# Patient Record
Sex: Male | Born: 1980
Health system: Southern US, Community
[De-identification: ages and names within clinical notes are randomized; demographics above are authoritative.]

## PROBLEM LIST (undated history)

## (undated) DIAGNOSIS — E669 Obesity, unspecified: Secondary | ICD-10-CM

## (undated) DIAGNOSIS — J45909 Unspecified asthma, uncomplicated: Secondary | ICD-10-CM

## (undated) HISTORY — DX: Obesity, unspecified: E66.9

---

## 2004-12-02 ENCOUNTER — Emergency Department (HOSPITAL_COMMUNITY): Admission: EM | Admit: 2004-12-02 | Discharge: 2004-12-02 | Payer: Self-pay | Admitting: Emergency Medicine

## 2005-04-28 ENCOUNTER — Emergency Department (HOSPITAL_COMMUNITY): Admission: EM | Admit: 2005-04-28 | Discharge: 2005-04-28 | Payer: Self-pay | Admitting: Emergency Medicine

## 2005-08-10 ENCOUNTER — Emergency Department (HOSPITAL_COMMUNITY): Admission: EM | Admit: 2005-08-10 | Discharge: 2005-08-10 | Payer: Self-pay | Admitting: Emergency Medicine

## 2015-08-15 ENCOUNTER — Emergency Department (HOSPITAL_COMMUNITY)
Admission: EM | Admit: 2015-08-15 | Discharge: 2015-08-15 | Disposition: A | Discharge status: Home / Self Care | Payer: Self-pay | Attending: Emergency Medicine | Admitting: Emergency Medicine

## 2015-08-15 ENCOUNTER — Encounter (HOSPITAL_COMMUNITY): Payer: Self-pay

## 2015-08-15 DIAGNOSIS — F17200 Nicotine dependence, unspecified, uncomplicated: Secondary | ICD-10-CM | POA: Insufficient documentation

## 2015-08-15 DIAGNOSIS — J45909 Unspecified asthma, uncomplicated: Secondary | ICD-10-CM | POA: Insufficient documentation

## 2015-08-15 DIAGNOSIS — J020 Streptococcal pharyngitis: Secondary | ICD-10-CM | POA: Insufficient documentation

## 2015-08-15 DIAGNOSIS — Z79899 Other long term (current) drug therapy: Secondary | ICD-10-CM | POA: Insufficient documentation

## 2015-08-15 HISTORY — DX: Unspecified asthma, uncomplicated: J45.909

## 2015-08-15 LAB — RAPID STREP SCREEN (MED CTR MEBANE ONLY): Streptococcus, Group A Screen (Direct): POSITIVE — AB

## 2015-08-15 MED ORDER — DEXAMETHASONE 4 MG PO TABS
10.0000 mg | ORAL_TABLET | Freq: Once | ORAL | Status: AC
  Administered 2015-08-15: 10 mg via ORAL
  Filled 2015-08-15: qty 2

## 2015-08-15 MED ORDER — PENICILLIN G BENZATHINE 1200000 UNIT/2ML IM SUSP
1.2000 10*6.[IU] | Freq: Once | INTRAMUSCULAR | Status: AC
  Administered 2015-08-15: 1.2 10*6.[IU] via INTRAMUSCULAR
  Filled 2015-08-15: qty 2

## 2015-08-15 NOTE — ED Provider Notes (Signed)
CSN: [id number]     Arrival date & time 08/15/15  1143 History   First MD Initiated Contact with Patient 08/15/15 1209     Chief Complaint  Patient presents with  . Sore Throat    HPI  35 YOM presents today with complaints of sore throat. Pt notes that symptoms started yesterday with sore throat, subjective fever, and painful swallowing. Pt denies any difficulty swallowing, changes in speech, drooling, neck stiffness, cough, chest pain, SOB, abd pain or vomiting. Pt notes that he has not tried nay over the counter therapies prior to arrival. No history of the same, no close sick contacts. No significant past medical history.   Past Medical History  Diagnosis Date  . Asthma    History reviewed. No pertinent past surgical history. No family history on file. Social History  Substance Use Topics  . Smoking status: Current Every Day Smoker  . Smokeless tobacco: None  . Alcohol Use: Yes     Comment: occasionally     Review of Systems  All other systems reviewed and are negative.   Allergies  Review of patient's allergies indicates no known allergies.  Home Medications   Prior to Admission medications   Medication Sig Start Date End Date Taking? Authorizing Provider  acetaminophen (TYLENOL) 500 MG tablet Take 1,000 mg by mouth every 4 (four) hours as needed for mild pain, moderate pain or headache.   Yes Historical Provider, MD  albuterol (PROVENTIL HFA;VENTOLIN HFA) 108 (90 Base) MCG/ACT inhaler Inhale 2 puffs into the lungs every 4 (four) hours as needed for wheezing or shortness of breath.   Yes Historical Provider, MD  diphenhydrAMINE (BENADRYL) 25 mg capsule Take 25 mg by mouth every 6 (six) hours as needed for itching or allergies.   Yes Historical Provider, MD   BP 108/78 mmHg  Pulse 103  Temp(Src) 100.1 F (37.8 C) (Oral)  Resp 24  SpO2 96%   Physical Exam  Constitutional: He is oriented to person, place, and time. He appears well-developed and well-nourished.  HENT:   Head: Normocephalic and atraumatic.  Bilateral kissing tonsill's, minor exudate, no identifiable post pharyngeal swelling or edema. No swelling of the uvula; midline. No ulcerations noted   Eyes: Conjunctivae are normal. Pupils are equal, round, and reactive to light. Right eye exhibits no discharge. Left eye exhibits no discharge. No scleral icterus.  Neck: Normal range of motion. No JVD present. No tracheal deviation present.  Cardiovascular: Regular rhythm, normal heart sounds and intact distal pulses.  Exam reveals no friction rub.   No murmur heard. Pulmonary/Chest: Effort normal and breath sounds normal. No stridor. No respiratory distress. He has no wheezes. He has no rales. He exhibits no tenderness.  Musculoskeletal: Normal range of motion. He exhibits no edema or tenderness.  Neurological: He is alert and oriented to person, place, and time. Coordination normal.  Skin: Skin is warm and dry. No rash noted. No erythema. No pallor.  Psychiatric: He has a normal mood and affect. His behavior is normal. Judgment and thought content normal.  Nursing note and vitals reviewed.   ED Course  Procedures (including critical care time) Labs Review Labs Reviewed  RAPID STREP SCREEN (NOT AT [facility]) - Abnormal; Notable for the following:    Streptococcus, Group A Screen (Direct) POSITIVE (*)    All other components within normal limits    Imaging Review No results found. I have personally reviewed and evaluated these images and lab results as part of my medical decision-making.  EKG Interpretation None      MDM   Final diagnoses:  Strep pharyngitis    Labs: rapid strep- negative  Imaging:  Consults:  Therapeutics: penicillin , decadron   Discharge Meds:   Assessment/Plan: Pt presents with likely strep pharyngitis. No difficulty swallowing, drooling, dysphonia,muffled voice, stridor, swelling of the neck, trismus, mouth pain, swelling/ pain in submandibular area or floor of  mouth ,assymetry of tonsils, or ulcerations; unlikely epiglottitis, PTA, submandibular space infection, retropharyngeal space infection, or HIV. Pt treated here in the ED with therapeutics listed above, given strict return precautions, PCP follow-up for re-evaluation if symptoms persist beyond 5-7 days in duration, return to the ED if they worsen. Pt verbalized understanding and agreement to today's plan and had no further questions or concerns at the time of discharge.         Eyvonne MechanicJeffrey Vladimir Lenhoff, PA-C 08/16/15 [id number]  Margarita Grizzleanielle Ray, MD 08/18/15 [phone]

## 2015-08-15 NOTE — ED Notes (Signed)
Pt presents with c/o sore throat that started yesterday. Pt's speech is somewhat muffled, but he is able to talk in complete sentences, no distress, ambulatory to triage. Pt reports he started with the sore throat and trouble swallowing yesterday.

## 2015-08-15 NOTE — Discharge Instructions (Signed)
You have been diagnosed with strep pharyngitis today. Please monitor for any worsening signs or symptoms, return to the emergency room immediately if any present.

## 2015-08-23 ENCOUNTER — Emergency Department (HOSPITAL_COMMUNITY)
Admission: EM | Admit: 2015-08-23 | Discharge: 2015-08-23 | Disposition: A | Discharge status: Home / Self Care | Payer: No Typology Code available for payment source | Attending: Emergency Medicine | Admitting: Emergency Medicine

## 2015-08-23 ENCOUNTER — Encounter (HOSPITAL_COMMUNITY): Payer: Self-pay | Admitting: Emergency Medicine

## 2015-08-23 DIAGNOSIS — S0990XA Unspecified injury of head, initial encounter: Secondary | ICD-10-CM | POA: Insufficient documentation

## 2015-08-23 DIAGNOSIS — J45909 Unspecified asthma, uncomplicated: Secondary | ICD-10-CM | POA: Insufficient documentation

## 2015-08-23 DIAGNOSIS — S8991XA Unspecified injury of right lower leg, initial encounter: Secondary | ICD-10-CM | POA: Diagnosis not present

## 2015-08-23 DIAGNOSIS — M7918 Myalgia, other site: Secondary | ICD-10-CM

## 2015-08-23 DIAGNOSIS — Z79899 Other long term (current) drug therapy: Secondary | ICD-10-CM | POA: Diagnosis not present

## 2015-08-23 DIAGNOSIS — Y998 Other external cause status: Secondary | ICD-10-CM | POA: Insufficient documentation

## 2015-08-23 DIAGNOSIS — F17200 Nicotine dependence, unspecified, uncomplicated: Secondary | ICD-10-CM | POA: Diagnosis not present

## 2015-08-23 DIAGNOSIS — Y92410 Unspecified street and highway as the place of occurrence of the external cause: Secondary | ICD-10-CM | POA: Diagnosis not present

## 2015-08-23 DIAGNOSIS — S4990XA Unspecified injury of shoulder and upper arm, unspecified arm, initial encounter: Secondary | ICD-10-CM | POA: Diagnosis not present

## 2015-08-23 DIAGNOSIS — S3992XA Unspecified injury of lower back, initial encounter: Secondary | ICD-10-CM | POA: Insufficient documentation

## 2015-08-23 DIAGNOSIS — Y9389 Activity, other specified: Secondary | ICD-10-CM | POA: Insufficient documentation

## 2015-08-23 MED ORDER — NAPROXEN 375 MG PO TABS: 375.0000 mg | ORAL_TABLET | Freq: Two times a day (BID) | ORAL | Status: DC
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
## 2015-08-25 ENCOUNTER — Emergency Department (HOSPITAL_COMMUNITY)
Admission: EM | Admit: 2015-08-25 | Discharge: 2015-08-25 | Disposition: A | Discharge status: Home / Self Care | Payer: No Typology Code available for payment source | Attending: Emergency Medicine | Admitting: Emergency Medicine

## 2015-08-25 ENCOUNTER — Emergency Department (HOSPITAL_COMMUNITY): Payer: No Typology Code available for payment source

## 2015-08-25 ENCOUNTER — Encounter (HOSPITAL_COMMUNITY): Payer: Self-pay | Admitting: Emergency Medicine

## 2015-08-25 DIAGNOSIS — Y92410 Unspecified street and highway as the place of occurrence of the external cause: Secondary | ICD-10-CM | POA: Insufficient documentation

## 2015-08-25 DIAGNOSIS — Y9389 Activity, other specified: Secondary | ICD-10-CM | POA: Insufficient documentation

## 2015-08-25 DIAGNOSIS — S3992XA Unspecified injury of lower back, initial encounter: Secondary | ICD-10-CM | POA: Diagnosis present

## 2015-08-25 DIAGNOSIS — S29002A Unspecified injury of muscle and tendon of back wall of thorax, initial encounter: Secondary | ICD-10-CM | POA: Insufficient documentation

## 2015-08-25 DIAGNOSIS — Y998 Other external cause status: Secondary | ICD-10-CM | POA: Insufficient documentation

## 2015-08-25 DIAGNOSIS — M549 Dorsalgia, unspecified: Secondary | ICD-10-CM

## 2015-08-25 DIAGNOSIS — J45909 Unspecified asthma, uncomplicated: Secondary | ICD-10-CM | POA: Diagnosis not present

## 2015-08-25 DIAGNOSIS — F17200 Nicotine dependence, unspecified, uncomplicated: Secondary | ICD-10-CM | POA: Insufficient documentation

## 2015-08-25 MED ORDER — IBUPROFEN 400 MG PO TABS
800.0000 mg | ORAL_TABLET | Freq: Once | ORAL | Status: AC
  Administered 2015-08-25: 800 mg via ORAL
  Filled 2015-08-25: qty 2

## 2015-08-25 MED ORDER — METHOCARBAMOL 500 MG PO TABS: 500.0000 mg | ORAL_TABLET | Freq: Two times a day (BID) | ORAL | Status: DC

## 2015-08-25 MED ORDER — IBUPROFEN 800 MG PO TABS: 800.0000 mg | ORAL_TABLET | Freq: Three times a day (TID) | ORAL | Status: DC

## 2015-08-25 MED ORDER — METHOCARBAMOL 500 MG PO TABS
500.0000 mg | ORAL_TABLET | Freq: Once | ORAL | Status: AC
  Administered 2015-08-25: 500 mg via ORAL
  Filled 2015-08-25: qty 1
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
## 2015-08-25 NOTE — Discharge Instructions (Signed)
1. Medications: ibuprofen, robaxin, usual home medications 2. Treatment: rest, drink plenty of fluids 3. Follow Up: please followup with your primary doctor for discussion of your diagnoses and further evaluation after today's visit; if you do not have a primary care doctor use the phone number listed in your discharge paperwork to find one; please return to the ER for numbness, weakness, loss of control of your bowel or bladder, new or worsening symptoms   Back Pain, Adult Back pain is very common. The pain often gets better over time. The cause of back pain is usually not dangerous. Most people can learn to manage their back pain on their own.  HOME CARE  Watch your back pain for any changes. The following actions may help to lessen any pain you are feeling:  Stay active. Start with short walks on flat ground if you can. Try to walk farther each day.  Exercise regularly as told by your doctor. Exercise helps your back heal faster. It also helps avoid future injury by keeping your muscles strong and flexible.  Do not sit, drive, or stand in one place for more than 30 minutes.  Do not stay in bed. Resting more than 1-2 days can slow down your recovery.  Be careful when you bend or lift an object. Use good form when lifting:  Bend at your knees.  Keep the object close to your body.  Do not twist.  Sleep on a firm mattress. Lie on your side, and bend your knees. If you lie on your back, put a pillow under your knees.  Take medicines only as told by your doctor.  Put ice on the injured area.  Put ice in a plastic bag.  Place a towel between your skin and the bag.  Leave the ice on for 20 minutes, 2-3 times a day for the first 2-3 days. After that, you can switch between ice and heat packs.  Avoid feeling anxious or stressed. Find good ways to deal with stress, such as exercise.  Maintain a healthy weight. Extra weight puts stress on your back. GET HELP IF:   You have pain that  does not go away with rest or medicine.  You have worsening pain that goes down into your legs or buttocks.  You have pain that does not get better in one week.  You have pain at night.  You lose weight.  You have a fever or chills. GET HELP RIGHT AWAY IF:   You cannot control when you poop (bowel movement) or pee (urinate).  Your arms or legs feel weak.  Your arms or legs lose feeling (numbness).  You feel sick to your stomach (nauseous) or throw up (vomit).  You have belly (abdominal) pain.  You feel like you may pass out (faint).   This information is not intended to replace advice given to you by your health care provider. Make sure you discuss any questions you have with your health care provider.   Document Released: 10/05/2007 Document Revised: 05/09/2014 Document Reviewed: 08/20/2013 Elsevier Interactive Patient Education [employer]2016 [employer]

## 2017-10-13 ENCOUNTER — Other Ambulatory Visit: Payer: Self-pay

## 2017-10-13 ENCOUNTER — Encounter: Payer: Self-pay | Admitting: Emergency Medicine

## 2017-10-13 ENCOUNTER — Emergency Department: Payer: Worker's Compensation

## 2017-10-13 ENCOUNTER — Emergency Department
Admission: EM | Admit: 2017-10-13 | Discharge: 2017-10-13 | Disposition: A | Discharge status: Home / Self Care | Payer: Worker's Compensation | Attending: Student in an Organized Health Care Education/Training Program | Admitting: Student in an Organized Health Care Education/Training Program

## 2017-10-13 DIAGNOSIS — Z79899 Other long term (current) drug therapy: Secondary | ICD-10-CM | POA: Insufficient documentation

## 2017-10-13 DIAGNOSIS — Y9389 Activity, other specified: Secondary | ICD-10-CM | POA: Insufficient documentation

## 2017-10-13 DIAGNOSIS — S99922A Unspecified injury of left foot, initial encounter: Secondary | ICD-10-CM | POA: Diagnosis present

## 2017-10-13 DIAGNOSIS — Y92812 Truck as the place of occurrence of the external cause: Secondary | ICD-10-CM | POA: Insufficient documentation

## 2017-10-13 DIAGNOSIS — W1789XA Other fall from one level to another, initial encounter: Secondary | ICD-10-CM | POA: Diagnosis not present

## 2017-10-13 DIAGNOSIS — F17200 Nicotine dependence, unspecified, uncomplicated: Secondary | ICD-10-CM | POA: Diagnosis not present

## 2017-10-13 DIAGNOSIS — J45909 Unspecified asthma, uncomplicated: Secondary | ICD-10-CM | POA: Insufficient documentation

## 2017-10-13 DIAGNOSIS — S93602A Unspecified sprain of left foot, initial encounter: Secondary | ICD-10-CM | POA: Insufficient documentation

## 2017-10-13 DIAGNOSIS — Y990 Civilian activity done for income or pay: Secondary | ICD-10-CM | POA: Insufficient documentation

## 2017-10-13 MED ORDER — MELOXICAM 15 MG PO TABS: 15.0000 mg | ORAL_TABLET | Freq: Every day | ORAL | 0 refills | Status: DC

## 2017-10-13 NOTE — ED Triage Notes (Signed)
While getting out of a paint truck at work this morning, injured left foot.  Works for [employer].

## 2017-10-13 NOTE — ED Provider Notes (Signed)
[facility] Emergency Department Provider Note  ____________________________________________  Time seen: Approximately 6:14 PM  I have reviewed the triage vital signs and the nursing notes.   HISTORY  Chief Complaint Foot Injury    HPI Trevor Novak is a 37 y.o. male who presents the emergency department complaining of left foot pain.  Patient reports that he was at work, stepped out of a truck, felt a sharp pain to the midfoot region.  Patient has been ambulatory on his foot all day but reports intermittent periodic sharp pain to the midfoot region.  Patient denies any edema or ecchymosis.  No medications for this complaint.  No history of previous foot injury or surgeries.  No other complaints at this time.    Past Medical History:  Diagnosis Date  . Asthma     There are no active problems to display for this patient.   History reviewed. No pertinent surgical history.  Prior to Admission medications   Medication Sig Start Date End Date Taking? Authorizing Provider  acetaminophen (TYLENOL) 500 MG tablet Take 1,000 mg by mouth every 4 (four) hours as needed for mild pain, moderate pain or headache.    [provider]  albuterol (PROVENTIL HFA;VENTOLIN HFA) 108 (90 Base) MCG/ACT inhaler Inhale 2 puffs into the lungs every 4 (four) hours as needed for wheezing or shortness of breath.    [provider]  diphenhydrAMINE (BENADRYL) 25 mg capsule Take 25 mg by mouth every 6 (six) hours as needed for itching or allergies.    [provider]  ibuprofen (ADVIL,MOTRIN) 800 MG tablet Take 1 tablet (800 mg total) by mouth 3 (three) times daily. 08/25/15   Mady GemmaWestfall, Elizabeth C, PA-C  meloxicam (MOBIC) 15 MG tablet Take 1 tablet (15 mg total) by mouth daily. 10/13/17   Cuthriell, Delorise RoyalsJonathan D, PA-C  methocarbamol (ROBAXIN) 500 MG tablet Take 1 tablet (500 mg total) by mouth 2 (two) times daily. 08/25/15   Mady GemmaWestfall, Elizabeth C, PA-C  naproxen  (NAPROSYN) 375 MG tablet Take 1 tablet (375 mg total) by mouth 2 (two) times daily. 08/23/15   Audry PiliMohr, Tyler, PA-C    Allergies Patient has no known allergies.  No family history on file.  Social History Social History   Tobacco Use  . Smoking status: Current Every Day Smoker  . Smokeless tobacco: Never Used  Substance Use Topics  . Alcohol use: Yes    Comment: occasionally   . Drug use: No     Review of Systems  Constitutional: No fever/chills Eyes: No visual changes.  Cardiovascular: no chest pain. Respiratory: no cough. No SOB. Gastrointestinal: No abdominal pain.  No nausea, no vomiting.   Musculoskeletal: Positive for pain to the left foot Skin: Negative for rash, abrasions, lacerations, ecchymosis. Neurological: Negative for headaches, focal weakness or numbness. 10-point ROS otherwise negative.  ____________________________________________   PHYSICAL EXAM:  VITAL SIGNS: ED Triage Vitals  Enc Vitals Group     BP 10/13/17 1728 134/88     Pulse Rate 10/13/17 1727 95     Resp 10/13/17 1727 16     Temp 10/13/17 1727 98.5 F (36.9 C)     Temp Source 10/13/17 1727 Oral     SpO2 10/13/17 1727 97 %     Weight 10/13/17 1725 200 lb (90.7 kg)     Height 10/13/17 1725 5\' 4"  (1.626 m)     Head Circumference --      Peak Flow --      Pain Score  10/13/17 1725 5     Pain Loc --      Pain Edu? --      Excl. in GC? --      Constitutional: Alert and oriented. Well appearing and in no acute distress. Eyes: Conjunctivae are normal. PERRL. EOMI. Head: Atraumatic. Neck: No stridor.    Cardiovascular: Normal rate, regular rhythm. Normal S1 and S2.  Good peripheral circulation. Respiratory: Normal respiratory effort without tachypnea or retractions. Lungs CTAB. Good air entry to the bases with no decreased or absent breath sounds. Musculoskeletal: Full range of motion to all extremities. No gross deformities appreciated.  No visible deformity, edema, ecchymosis, abrasions or  lacerations noted to the foot.  Full range of motion to the left ankle in all digits left foot.  Patient is tender to palpation over the base of the fourth metatarsal with no palpable abnormality or deficits.  Dorsalis pedis pulse intact.  Sensation intact all 5 digits. Neurologic:  Normal speech and language. No gross focal neurologic deficits are appreciated.  Skin:  Skin is warm, dry and intact. No rash noted. Psychiatric: Mood and affect are normal. Speech and behavior are normal. Patient exhibits appropriate insight and judgement.   ____________________________________________   LABS (all labs ordered are listed, but only abnormal results are displayed)  Labs Reviewed - No data to display ____________________________________________  EKG   ____________________________________________  RADIOLOGY I personally viewed and evaluated these images as part of my medical decision making, as well as reviewing the written report by the radiologist.  I concur with radiologist finding of no acute osseous abnormality to the left foot  Dg Foot Complete Left  Result Date: 10/13/2017 CLINICAL DATA:  Pain after trauma. EXAM: LEFT FOOT - COMPLETE 3+ VIEW COMPARISON:  None. FINDINGS: Hallux valgus deformity in the left great toe. IMPRESSION: Negative. Electronically Signed   By: Gerome Sam III M.D   On: 10/13/2017 17:53    ____________________________________________    PROCEDURES  Procedure(s) performed:    Procedures    Medications - No data to display   ____________________________________________   INITIAL IMPRESSION / ASSESSMENT AND PLAN / ED COURSE  Pertinent labs & imaging results that were available during my care of the patient were reviewed by me and considered in my medical decision making (see chart for details).  Review of the [place] CSRS was performed in accordance of the [facility] prior to dispensing any controlled drugs.      Patient's diagnosis is consistent with a  foot sprain.  Patient presents the emergency department complaining of left foot pain after stepping out of a truck.  Exam is reassuring.  X-ray reveals no acute osseous abnormality.  No indication for further work-up.  Postop shoe given for comfort.  Patient will be placed on meloxicam for additional symptomatic improvement.  Patient will follow-up with orthopedics if symptoms do not improve..  Patient is given ED precautions to return to the ED for any worsening or new symptoms.     ____________________________________________  FINAL CLINICAL IMPRESSION(S) / ED DIAGNOSES  Final diagnoses:  Sprain of left foot, initial encounter      NEW MEDICATIONS STARTED DURING THIS VISIT:  ED Discharge Orders        Ordered    meloxicam (MOBIC) 15 MG tablet  Daily     10/13/17 1813          This chart was dictated using voice recognition software/Dragon. Despite best efforts to proofread, errors can occur which can change the meaning. Any  change was purely unintentional.    Racheal Patches, PA-C 10/13/17 1817    Willy Eddy, MD 10/13/17 Ebony Cargo

## 2017-11-11 ENCOUNTER — Encounter (HOSPITAL_COMMUNITY): Payer: Self-pay | Admitting: Emergency Medicine

## 2017-11-11 ENCOUNTER — Emergency Department (HOSPITAL_COMMUNITY)
Admission: EM | Admit: 2017-11-11 | Discharge: 2017-11-11 | Disposition: A | Discharge status: Home / Self Care | Payer: PRIVATE HEALTH INSURANCE | Attending: Emergency Medicine | Admitting: Emergency Medicine

## 2017-11-11 ENCOUNTER — Other Ambulatory Visit: Payer: Self-pay

## 2017-11-11 DIAGNOSIS — F17200 Nicotine dependence, unspecified, uncomplicated: Secondary | ICD-10-CM | POA: Insufficient documentation

## 2017-11-11 DIAGNOSIS — B356 Tinea cruris: Secondary | ICD-10-CM | POA: Insufficient documentation

## 2017-11-11 DIAGNOSIS — J45909 Unspecified asthma, uncomplicated: Secondary | ICD-10-CM | POA: Insufficient documentation

## 2017-11-11 DIAGNOSIS — Z79899 Other long term (current) drug therapy: Secondary | ICD-10-CM | POA: Insufficient documentation

## 2017-11-11 MED ORDER — CLOTRIMAZOLE 1 % EX CREA: TOPICAL_CREAM | CUTANEOUS | 0 refills | Status: DC

## 2017-11-11 NOTE — ED Provider Notes (Signed)
[facility]-EMERGENCY DEPT Provider Note   CSN: [id number] Arrival date & time: 11/11/17  0116     History   Chief Complaint Chief Complaint  Patient presents with  . Recurrent Skin Infections    HPI Domingo PulseDarin Basilio is a 37 y.o. male.  Patient presents with itching rash to groin that is recurrent and previously diagnosed as "jock itch". No drainage. No testicular pain or scrotal swelling. No dysuria or penile rash. He did not use anything for symptoms prior to arrival.   The history is provided by the patient. No language interpreter was used.    Past Medical History:  Diagnosis Date  . Asthma     There are no active problems to display for this patient.   History reviewed. No pertinent surgical history.      Home Medications    Prior to Admission medications   Medication Sig Start Date End Date Taking? Authorizing Provider  acetaminophen (TYLENOL) 500 MG tablet Take 1,000 mg by mouth every 4 (four) hours as needed for mild pain, moderate pain or headache.    [provider]  albuterol (PROVENTIL HFA;VENTOLIN HFA) 108 (90 Base) MCG/ACT inhaler Inhale 2 puffs into the lungs every 4 (four) hours as needed for wheezing or shortness of breath.    [provider]  diphenhydrAMINE (BENADRYL) 25 mg capsule Take 25 mg by mouth every 6 (six) hours as needed for itching or allergies.    [provider]  ibuprofen (ADVIL,MOTRIN) 800 MG tablet Take 1 tablet (800 mg total) by mouth 3 (three) times daily. 08/25/15   Mady GemmaWestfall, Elizabeth C, PA-C  meloxicam (MOBIC) 15 MG tablet Take 1 tablet (15 mg total) by mouth daily. 10/13/17   Cuthriell, Delorise RoyalsJonathan D, PA-C  methocarbamol (ROBAXIN) 500 MG tablet Take 1 tablet (500 mg total) by mouth 2 (two) times daily. 08/25/15   Mady GemmaWestfall, Elizabeth C, PA-C  naproxen (NAPROSYN) 375 MG tablet Take 1 tablet (375 mg total) by mouth 2 (two) times daily. 08/23/15   Audry PiliMohr, Tyler, PA-C    Family History No family  history on file.  Social History Social History   Tobacco Use  . Smoking status: Current Every Day Smoker  . Smokeless tobacco: Never Used  Substance Use Topics  . Alcohol use: Yes    Comment: occasionally   . Drug use: No     Allergies   Patient has no known allergies.   Review of Systems Review of Systems  Genitourinary: Negative for dysuria.       See HPI.  Skin: Positive for rash.     Physical Exam Updated Vital Signs BP (!) 139/97 (BP Location: Left Arm)   Pulse 80   Temp 98.1 F (36.7 C) (Oral)   Resp 18   SpO2 99%   Physical Exam  Constitutional: He is oriented to person, place, and time. He appears well-developed and well-nourished.  Neck: Normal range of motion.  Pulmonary/Chest: Effort normal.  Genitourinary:  Genitourinary Comments: Mild rash to scrotum that is mildly erythematous, scaling. No induration, swelling or tenderness. No extension of rash beyond the scrotum. Penis circumcised and without rash.   Musculoskeletal: Normal range of motion.  Neurological: He is alert and oriented to person, place, and time.  Skin: Skin is warm and dry.  Psychiatric: He has a normal mood and affect.     ED Treatments / Results  Labs (all labs ordered are listed, but only abnormal results are displayed) Labs Reviewed - No data to display  EKG None  Radiology No results found.  Procedures Procedures (including critical care time)  Medications Ordered in ED Medications - No data to display   Initial Impression / Assessment and Plan / ED Course  I have reviewed the triage vital signs and the nursing notes.  Pertinent labs & imaging results that were available during my care of the patient were reviewed by me and considered in my medical decision making (see chart for details).     Patient with recurrent rash c/w tinea cruris  Final Clinical Impressions(s) / ED Diagnoses   Final diagnoses:  None   1. Tinea cruris  ED Discharge Orders     None       Elpidio Anis, Cordelia Poche 11/11/17 1610    Dione Booze, MD 11/11/17 [phone]

## 2017-11-11 NOTE — ED Triage Notes (Signed)
Pt complaining of jock itch that began today. Pt states he has not tried any form of medicated power/cream prior to arrival.

## 2018-10-03 ENCOUNTER — Other Ambulatory Visit: Payer: Self-pay

## 2018-10-03 ENCOUNTER — Encounter (HOSPITAL_COMMUNITY): Payer: Self-pay | Admitting: Emergency Medicine

## 2018-10-03 ENCOUNTER — Emergency Department (HOSPITAL_COMMUNITY)
Admission: EM | Admit: 2018-10-03 | Discharge: 2018-10-04 | Disposition: A | Discharge status: Home / Self Care | Payer: PRIVATE HEALTH INSURANCE | Attending: Emergency Medicine | Admitting: Emergency Medicine

## 2018-10-03 DIAGNOSIS — Z79899 Other long term (current) drug therapy: Secondary | ICD-10-CM | POA: Insufficient documentation

## 2018-10-03 DIAGNOSIS — F17200 Nicotine dependence, unspecified, uncomplicated: Secondary | ICD-10-CM | POA: Insufficient documentation

## 2018-10-03 DIAGNOSIS — E860 Dehydration: Secondary | ICD-10-CM

## 2018-10-03 DIAGNOSIS — J45909 Unspecified asthma, uncomplicated: Secondary | ICD-10-CM | POA: Insufficient documentation

## 2018-10-03 LAB — BASIC METABOLIC PANEL
Anion gap: 12 (ref 5–15)
BUN: 14 mg/dL (ref 6–20)
CO2: 24 mmol/L (ref 22–32)
Calcium: 9.3 mg/dL (ref 8.9–10.3)
Chloride: 91 mmol/L — ABNORMAL LOW (ref 98–111)
Creatinine, Ser: 1.42 mg/dL — ABNORMAL HIGH (ref 0.61–1.24)
GFR calc Af Amer: >60 mL/min (ref >60)
GFR calc non Af Amer: >60 mL/min (ref >60)
Glucose, Bld: 100 mg/dL — ABNORMAL HIGH (ref 70–99)
Potassium: 3.2 mmol/L — ABNORMAL LOW (ref 3.5–5.1)
Sodium: 127 mmol/L — ABNORMAL LOW (ref 135–145)

## 2018-10-03 LAB — CBC
HCT: 39 % (ref 39.0–52.0)
Hemoglobin: 13.6 g/dL (ref 13.0–17.0)
MCH: 31.8 pg (ref 26.0–34.0)
MCHC: 34.9 g/dL (ref 30.0–36.0)
MCV: 91.1 fL (ref 80.0–100.0)
Platelets: 273 10*3/uL (ref 150–400)
RBC: 4.28 MIL/uL (ref 4.22–5.81)
RDW: 12.5 % (ref 11.5–15.5)
WBC: 15 10*3/uL — ABNORMAL HIGH (ref 4.0–10.5)
nRBC: 0 % (ref 0.0–0.2)

## 2018-10-03 LAB — URINALYSIS, ROUTINE W REFLEX MICROSCOPIC
Bilirubin Urine: NEGATIVE
Glucose, UA: NEGATIVE mg/dL
Hgb urine dipstick: NEGATIVE
Ketones, ur: NEGATIVE mg/dL
Leukocytes,Ua: NEGATIVE
Nitrite: NEGATIVE
Protein, ur: NEGATIVE mg/dL
Specific Gravity, Urine: 1.001 — ABNORMAL LOW (ref 1.005–1.030)
pH: 6 (ref 5.0–8.0)

## 2018-10-03 MED ORDER — SODIUM CHLORIDE 0.9% FLUSH: 3.0000 mL | Freq: Once | INTRAVENOUS | Status: DC

## 2018-10-03 NOTE — ED Triage Notes (Signed)
Pt c/o weakness, nausea and an episode of dizziness while driving home today. 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 Denies dizziness at this time, states he has been trying to increase his water intake this evening.

## 2018-10-04 LAB — CK: Total CK: 495 U/L — ABNORMAL HIGH (ref 49–397)

## 2018-10-04 MED ORDER — ACETAMINOPHEN 500 MG PO TABS
1000.0000 mg | ORAL_TABLET | Freq: Once | ORAL | Status: AC
  Administered 2018-10-04: 1000 mg via ORAL
  Filled 2018-10-04: qty 2

## 2018-10-04 MED ORDER — SODIUM CHLORIDE 0.9 % IV BOLUS
1000.0000 mL | Freq: Once | INTRAVENOUS | Status: AC
  Administered 2018-10-04: 1000 mL via INTRAVENOUS

## 2018-10-04 NOTE — ED Provider Notes (Signed)
[facility] EMERGENCY DEPARTMENT Provider Note   CSN: [id number] Arrival date & time: 10/03/18  1918    History   Chief Complaint Chief Complaint  Patient presents with  . Weakness    HPI Jagur Aksamit is a 38 y.o. male.     38 year old male with no significant past medical history presents to the emergency department for generalized weakness which began while at work today.  He noticed symptoms around 1600.  Reports some lightheadedness as well as nausea.  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  Notes some aching in different extremities.  He feels that his symptoms have spontaneously improved, but persisted.  No fevers, vomiting, chest pain, new or worsening SOB. Denies sick contacts.  The history is provided by the patient. No language interpreter was used.  Weakness    Past Medical History:  Diagnosis Date  . Asthma     There are no active problems to display for this patient.   History reviewed. No pertinent surgical history.      Home Medications    Prior to Admission medications   Medication Sig Start Date End Date Taking? Authorizing Provider  acetaminophen (TYLENOL) 500 MG tablet Take 1,000 mg by mouth every 4 (four) hours as needed for mild pain, moderate pain or headache.    [provider]  albuterol (PROVENTIL HFA;VENTOLIN HFA) 108 (90 Base) MCG/ACT inhaler Inhale 2 puffs into the lungs every 4 (four) hours as needed for wheezing or shortness of breath.    [provider]  clotrimazole (LOTRIMIN) 1 % cream Apply to affected area 2 times daily 11/11/17   Elpidio Anis, PA-C  diphenhydrAMINE (BENADRYL) 25 mg capsule Take 25 mg by mouth every 6 (six) hours as needed for itching or allergies.    [provider]  ibuprofen (ADVIL,MOTRIN) 800 MG tablet Take 1 tablet (800 mg total) by mouth 3 (three) times daily. 08/25/15   Mady Gemma, PA-C  meloxicam  (MOBIC) 15 MG tablet Take 1 tablet (15 mg total) by mouth daily. 10/13/17   Cuthriell, Delorise Royals, PA-C  methocarbamol (ROBAXIN) 500 MG tablet Take 1 tablet (500 mg total) by mouth 2 (two) times daily. 08/25/15   Mady Gemma, PA-C  naproxen (NAPROSYN) 375 MG tablet Take 1 tablet (375 mg total) by mouth 2 (two) times daily. 08/23/15   Audry Pili, PA-C    Family History No family history on file.  Social History Social History   Tobacco Use  . Smoking status: Current Every Day Smoker  . Smokeless tobacco: Never Used  Substance Use Topics  . Alcohol use: Yes    Comment: occasionally   . Drug use: No     Allergies   Patient has no known allergies.   Review of Systems Review of Systems  Neurological: Positive for weakness.  Ten systems reviewed and are negative for acute change, except as noted in the HPI.    Physical Exam Updated Vital Signs BP (!) 135/91   Pulse 75   Temp 97.9 F (36.6 C) (Oral)   Resp 16   Ht 5\' 4"  (1.626 m)   Wt 90.7 kg   SpO2 98%   BMI 34.33 kg/m   Physical Exam Vitals signs and nursing note reviewed.  Constitutional:      General: He is not in acute distress.    Appearance: He is well-developed. He is not diaphoretic.     Comments: Nontoxic  appearing and in NAD  HENT:     Head: Normocephalic and atraumatic.  Eyes:     General: No scleral icterus.    Conjunctiva/sclera: Conjunctivae normal.  Neck:     Musculoskeletal: Normal range of motion.  Cardiovascular:     Rate and Rhythm: Normal rate and regular rhythm.     Pulses: Normal pulses.  Pulmonary:     Effort: Pulmonary effort is normal. No respiratory distress.     Breath sounds: No stridor. No wheezing.     Comments: Respirations even and unlabored Musculoskeletal: Normal range of motion.  Skin:    General: Skin is warm and dry.     Coloration: Skin is not pale.     Findings: No erythema or rash.  Neurological:     General: No focal deficit present.     Mental Status:  He is alert and oriented to person, place, and time.     Coordination: Coordination normal.  Psychiatric:        Behavior: Behavior normal.      ED Treatments / Results  Labs (all labs ordered are listed, but only abnormal results are displayed) Labs Reviewed  BASIC METABOLIC PANEL - Abnormal; Notable for the following components:      Result Value   Sodium 127 (*)    Potassium 3.2 (*)    Chloride 91 (*)    Glucose, Bld 100 (*)    Creatinine, Ser 1.42 (*)    All other components within normal limits  CBC - Abnormal; Notable for the following components:   WBC 15.0 (*)    All other components within normal limits  URINALYSIS, ROUTINE W REFLEX MICROSCOPIC - Abnormal; Notable for the following components:   Color, Urine COLORLESS (*)    Specific Gravity, Urine 1.001 (*)    All other components within normal limits  CK - Abnormal; Notable for the following components:   Total CK 495 (*)    All other components within normal limits    EKG None  Radiology No results found.  Procedures Procedures (including critical care time)  Medications Ordered in ED Medications  sodium chloride flush (NS) 0.9 % injection 3 mL (has no administration in time range)  sodium chloride 0.9 % bolus 1,000 mL (1,000 mLs Intravenous New Bag/Given 10/04/18 0218)  acetaminophen (TYLENOL) tablet 1,000 mg (1,000 mg Oral Given 10/04/18 0211)    3:59 AM Patient reassessed.  He states that he is feeling better.  Blood pressure 117/70s.  Encouraged follow-up with a primary doctor.   Initial Impression / Assessment and Plan / ED Course  I have reviewed the triage vital signs and the nursing notes.  Pertinent labs & imaging results that were available during my care of the patient were reviewed by me and considered in my medical decision making (see chart for details).        38 year old male presenting for symptoms of acute dehydration.  He works outside in [employer] and began to feel  lightheaded, nauseous, with body aches this afternoon.  Blood work today with mild AKI.  Nonspecific leukocytosis.  CK slightly elevated at 495.  Patient was hydrated with 1 L IV fluids in the ED with symptomatic improvement.  He has been hemodynamically stable.  Counseled on oral rehydration and primary care follow-up.  Return precautions discussed and provided. Patient discharged in stable condition with no unaddressed concerns.   Final Clinical Impressions(s) / ED Diagnoses   Final diagnoses:  Acute dehydration    ED Discharge  Orders    None       Antony Madura, PA-C 10/04/18 0401    Zadie Rhine, MD 10/04/18 [phone]

## 2018-10-04 NOTE — ED Provider Notes (Signed)
ED ECG REPORT   Date: 10/03/2018   Rate: 81  Rhythm: normal sinus rhythm  QRS Axis: normal  Intervals: normal  ST/T Wave abnormalities: nonspecific T wave changes  Conduction Disutrbances:none  Narrative Interpretation:   Old EKG Reviewed: none available  I have personally reviewed the EKG tracing and agree with the computerized printout as noted.    Zadie Rhine, MD 10/04/18 [phone]

## 2018-10-04 NOTE — Discharge Instructions (Addendum)
Continue drinking plenty of fluids to prevent dehydration.  We recommend Tylenol or ibuprofen for any pain or body aches.  Follow-up with a primary care doctor as needed.  You may return for new or concerning symptoms.

## 2018-10-04 NOTE — ED Notes (Signed)
Pt discharged from ED; instructions provided; Pt encouraged to return to ED if symptoms worsen and to f/u with PCP; Pt verbalized understanding of all instructions 

## 2019-02-22 ENCOUNTER — Emergency Department (HOSPITAL_COMMUNITY)
Admission: EM | Admit: 2019-02-22 | Discharge: 2019-02-22 | Disposition: A | Discharge status: Home / Self Care | Payer: Self-pay | Attending: Emergency Medicine | Admitting: Emergency Medicine

## 2019-02-22 ENCOUNTER — Other Ambulatory Visit: Payer: Self-pay

## 2019-02-22 DIAGNOSIS — J4521 Mild intermittent asthma with (acute) exacerbation: Secondary | ICD-10-CM | POA: Insufficient documentation

## 2019-02-22 DIAGNOSIS — F17210 Nicotine dependence, cigarettes, uncomplicated: Secondary | ICD-10-CM | POA: Insufficient documentation

## 2019-02-22 MED ORDER — AEROCHAMBER PLUS FLO-VU LARGE MISC
Status: AC
  Filled 2019-02-22: qty 1

## 2019-02-22 MED ORDER — ALBUTEROL SULFATE HFA 108 (90 BASE) MCG/ACT IN AERS: 2.0000 | INHALATION_SPRAY | RESPIRATORY_TRACT | 12 refills | Status: DC | PRN

## 2019-02-22 MED ORDER — ALBUTEROL SULFATE HFA 108 (90 BASE) MCG/ACT IN AERS
8.0000 | INHALATION_SPRAY | Freq: Once | RESPIRATORY_TRACT | Status: AC
  Administered 2019-02-22: 8 via RESPIRATORY_TRACT
  Filled 2019-02-22: qty 6.7

## 2019-02-22 MED ORDER — AEROCHAMBER PLUS FLO-VU LARGE MISC
1.0000 | Freq: Once | Status: AC
  Administered 2019-02-22: 1

## 2019-02-22 NOTE — ED Provider Notes (Signed)
[facility] EMERGENCY DEPARTMENT Provider Note   CSN: [id number] Arrival date & time: 02/22/19  1431     History   Chief Complaint Chief Complaint  Patient presents with  . Asthma    HPI Trevor Novak is a 38 y.o. male.     38yo male with history of asthma presents with complaint of asthma exacerbation since running out of his inhaler a week ago.  Patient reports chest tightness, occasional nonproductive cough.  Patient has been using a Primatene inhaler to get by. Denies fevers or sick symptoms. Patient does not see a PCP, typically obtains inhalers from the ER.  Denies history of prior admissions related to his asthma.     Past Medical History:  Diagnosis Date  . Asthma     There are no active problems to display for this patient.   No past surgical history on file.      Home Medications    Prior to Admission medications   Medication Sig Start Date End Date Taking? Authorizing Provider  acetaminophen (TYLENOL) 500 MG tablet Take 1,000 mg by mouth every 4 (four) hours as needed for mild pain, moderate pain or headache.    [provider]  albuterol (VENTOLIN HFA) 108 (90 Base) MCG/ACT inhaler Inhale 2 puffs into the lungs every 4 (four) hours as needed for wheezing or shortness of breath. 02/22/19   Jeannie Fend, PA-C  clotrimazole (LOTRIMIN) 1 % cream Apply to affected area 2 times daily 11/11/17   Elpidio Anis, PA-C  diphenhydrAMINE (BENADRYL) 25 mg capsule Take 25 mg by mouth every 6 (six) hours as needed for itching or allergies.    [provider]  ibuprofen (ADVIL,MOTRIN) 800 MG tablet Take 1 tablet (800 mg total) by mouth 3 (three) times daily. 08/25/15   Mady Gemma, PA-C  meloxicam (MOBIC) 15 MG tablet Take 1 tablet (15 mg total) by mouth daily. 10/13/17   Cuthriell, Delorise Royals, PA-C  methocarbamol (ROBAXIN) 500 MG tablet Take 1 tablet (500 mg total) by mouth 2 (two) times daily. 08/25/15   Mady Gemma,  PA-C  naproxen (NAPROSYN) 375 MG tablet Take 1 tablet (375 mg total) by mouth 2 (two) times daily. 08/23/15   Audry Pili, PA-C    Family History No family history on file.  Social History Social History   Tobacco Use  . Smoking status: Current Every Day Smoker  . Smokeless tobacco: Never Used  Substance Use Topics  . Alcohol use: Yes    Comment: occasionally   . Drug use: No     Allergies   Patient has no known allergies.   Review of Systems Review of Systems  Constitutional: Negative for chills and fever.  HENT: Negative for congestion and sore throat.   Respiratory: Positive for cough, chest tightness and shortness of breath.   Cardiovascular: Negative for chest pain, palpitations and leg swelling.  Musculoskeletal: Negative for arthralgias and myalgias.  Skin: Negative for rash and wound.  Allergic/Immunologic: Negative for immunocompromised state.  Neurological: Negative for headaches.  Hematological: Negative for adenopathy. Does not bruise/bleed easily.  Psychiatric/Behavioral: Negative for confusion.  All other systems reviewed and are negative.    Physical Exam Updated Vital Signs BP 134/88 (BP Location: Right Arm)   Pulse (!) 116   Temp 97.7 F (36.5 C) (Oral)   Resp 16   Ht 5\' 4"  (1.626 m)   Wt 89.4 kg   SpO2 95%   BMI 33.81 kg/m   Physical Exam  Vitals signs and nursing note reviewed.  Constitutional:      General: He is not in acute distress.    Appearance: He is well-developed. He is not diaphoretic.  HENT:     Head: Normocephalic and atraumatic.  Cardiovascular:     Rate and Rhythm: Normal rate and regular rhythm.     Pulses: Normal pulses.     Heart sounds: Normal heart sounds.  Pulmonary:     Effort: Pulmonary effort is normal.     Breath sounds: Examination of the right-middle field reveals decreased breath sounds. Examination of the left-middle field reveals decreased breath sounds. Examination of the right-lower field reveals  decreased breath sounds. Examination of the left-lower field reveals decreased breath sounds. Decreased breath sounds present. No wheezing.  Musculoskeletal:     Right lower leg: No edema.     Left lower leg: No edema.  Skin:    General: Skin is warm and dry.     Findings: No erythema or rash.  Neurological:     Mental Status: He is alert and oriented to person, place, and time.  Psychiatric:        Behavior: Behavior normal.      ED Treatments / Results  Labs (all labs ordered are listed, but only abnormal results are displayed) Labs Reviewed - No data to display  EKG None  Radiology No results found.  Procedures Procedures (including critical care time)  Medications Ordered in ED Medications  AeroChamber Plus Flo-Vu Large MISC (has no administration in time range)  albuterol (VENTOLIN HFA) 108 (90 Base) MCG/ACT inhaler 8 puff (8 puffs Inhalation Given 02/22/19 1804)  AeroChamber Plus Flo-Vu Large MISC 1 each (1 each Other Given 02/22/19 1805)     Initial Impression / Assessment and Plan / ED Course  I have reviewed the triage vital signs and the nursing notes.  Pertinent labs & imaging results that were available during my care of the patient were reviewed by me and considered in my medical decision making (see chart for details).  Clinical Course as of Feb 21 1849  Fri Feb 21, 6861  [id number] 38 year old male with history of asthma presents with complaint of chest tightness, using OTC inhaler without relief, he is out of his albuterol inhaler.  Patient denies sick symptoms or history of prior innervation.  On initial exam patient had significantly diminished lung sounds, no wheezing.  Patient was given 8 puffs of an albuterol inhaler, is feeling much better, lung sounds are clear with improved air movement.  Patient feels ready for discharge, he will be discharged with refills for albuterol inhaler and referral to [facility] wellness for follow-up.   [LM]    Clinical  Course User Index [LM] Tacy Learn, PA-C      Final Clinical Impressions(s) / ED Diagnoses   Final diagnoses:  Mild intermittent asthma with exacerbation    ED Discharge Orders         Ordered    albuterol (VENTOLIN HFA) 108 (90 Base) MCG/ACT inhaler  Every 4 hours PRN     02/22/19 1849           Roque Lias 02/22/19 1850    Virgel Manifold, MD 02/22/19 2059

## 2019-02-22 NOTE — Discharge Instructions (Addendum)
Use inhaler as needed as prescribed.  Refills have been sent to your pharmacy.  Follow-up with [facility] and wellness to establish care for further refills.

## 2019-02-22 NOTE — ED Notes (Signed)
Patient verbalizes understanding of discharge instructions. Opportunity for questioning and answers were provided. Armband removed by staff, pt discharged from ED.  

## 2019-02-22 NOTE — ED Triage Notes (Signed)
Pt out of his rescue inhaler and in need of refill. Pt is breathing easily no sob or chest pain just states with exertion he does have wheezing.

## 2019-03-29 ENCOUNTER — Encounter (HOSPITAL_COMMUNITY): Payer: Self-pay | Admitting: Emergency Medicine

## 2019-03-29 ENCOUNTER — Other Ambulatory Visit: Payer: Self-pay

## 2019-03-29 ENCOUNTER — Emergency Department (HOSPITAL_COMMUNITY)
Admission: EM | Admit: 2019-03-29 | Discharge: 2019-03-29 | Disposition: A | Discharge status: Home / Self Care | Payer: Self-pay | Attending: Emergency Medicine | Admitting: Emergency Medicine

## 2019-03-29 DIAGNOSIS — Z791 Long term (current) use of non-steroidal anti-inflammatories (NSAID): Secondary | ICD-10-CM | POA: Insufficient documentation

## 2019-03-29 DIAGNOSIS — F17200 Nicotine dependence, unspecified, uncomplicated: Secondary | ICD-10-CM | POA: Insufficient documentation

## 2019-03-29 DIAGNOSIS — J069 Acute upper respiratory infection, unspecified: Secondary | ICD-10-CM

## 2019-03-29 DIAGNOSIS — Z79899 Other long term (current) drug therapy: Secondary | ICD-10-CM | POA: Insufficient documentation

## 2019-03-29 DIAGNOSIS — J45909 Unspecified asthma, uncomplicated: Secondary | ICD-10-CM | POA: Insufficient documentation

## 2019-03-29 DIAGNOSIS — U071 COVID-19: Secondary | ICD-10-CM | POA: Insufficient documentation

## 2019-03-29 LAB — SARS CORONAVIRUS 2 (TAT 6-24 HRS): SARS Coronavirus 2: POSITIVE — AB

## 2019-03-29 NOTE — ED Triage Notes (Signed)
States has sinus congestion since last night took some OTC med felt a little better but wants to be checked out

## 2019-03-29 NOTE — ED Provider Notes (Signed)
[facility] EMERGENCY DEPARTMENT Provider Note   CSN: [id number] Arrival date & time: 03/29/19  0946     History   Chief Complaint Chief Complaint  Patient presents with  . Nasal Congestion    HPI Trevor Novak is a 38 y.o. male.     The history is provided by the patient. No language interpreter was used.  Cough Cough characteristics:  Non-productive Sputum characteristics:  Nondescript Severity:  Moderate Onset quality:  Gradual Timing:  Constant Progression:  Worsening Chronicity:  New Smoker: no   Context: upper respiratory infection   Relieved by:  Nothing Worsened by:  Nothing Ineffective treatments:  None tried Associated symptoms: no myalgias   Risk factors: no recent infection   Pt worried about covid  Past Medical History:  Diagnosis Date  . Asthma     There are no active problems to display for this patient.   No past surgical history on file.      Home Medications    Prior to Admission medications   Medication Sig Start Date End Date Taking? Authorizing Provider  acetaminophen (TYLENOL) 500 MG tablet Take 1,000 mg by mouth every 4 (four) hours as needed for mild pain, moderate pain or headache.    [provider]  albuterol (VENTOLIN HFA) 108 (90 Base) MCG/ACT inhaler Inhale 2 puffs into the lungs every 4 (four) hours as needed for wheezing or shortness of breath. 02/22/19   Jeannie Fend, PA-C  clotrimazole (LOTRIMIN) 1 % cream Apply to affected area 2 times daily 11/11/17   Elpidio Anis, PA-C  diphenhydrAMINE (BENADRYL) 25 mg capsule Take 25 mg by mouth every 6 (six) hours as needed for itching or allergies.    [provider]  ibuprofen (ADVIL,MOTRIN) 800 MG tablet Take 1 tablet (800 mg total) by mouth 3 (three) times daily. 08/25/15   Mady Gemma, PA-C  meloxicam (MOBIC) 15 MG tablet Take 1 tablet (15 mg total) by mouth daily. 10/13/17   Cuthriell, Delorise Royals, PA-C  methocarbamol (ROBAXIN) 500  MG tablet Take 1 tablet (500 mg total) by mouth 2 (two) times daily. 08/25/15   Mady Gemma, PA-C  naproxen (NAPROSYN) 375 MG tablet Take 1 tablet (375 mg total) by mouth 2 (two) times daily. 08/23/15   Audry Pili, PA-C    Family History No family history on file.  Social History Social History   Tobacco Use  . Smoking status: Current Every Day Smoker  . Smokeless tobacco: Never Used  Substance Use Topics  . Alcohol use: Yes    Comment: occasionally   . Drug use: No     Allergies   Patient has no known allergies.   Review of Systems Review of Systems  Respiratory: Positive for cough.   Musculoskeletal: Negative for myalgias.  All other systems reviewed and are negative.    Physical Exam Updated Vital Signs BP (!) 146/106 (BP Location: Right Arm)   Pulse 90   Temp 98.8 F (37.1 C) (Oral)   Resp 16   SpO2 100%   Physical Exam Vitals signs and nursing note reviewed.  Constitutional:      Appearance: He is well-developed.  HENT:     Head: Normocephalic.  Neck:     Musculoskeletal: Normal range of motion.  Pulmonary:     Effort: Pulmonary effort is normal.  Abdominal:     General: There is no distension.  Musculoskeletal: Normal range of motion.  Neurological:     Mental Status: He is  alert and oriented to person, place, and time.      ED Treatments / Results  Labs (all labs ordered are listed, but only abnormal results are displayed) Labs Reviewed  SARS CORONAVIRUS 2 (TAT 6-24 HRS)    EKG None  Radiology No results found.  Procedures Procedures (including critical care time)  Medications Ordered in ED Medications - No data to display   Initial Impression / Assessment and Plan / ED Course  I have reviewed the triage vital signs and the nursing notes.  Pertinent labs & imaging results that were available during my care of the patient were reviewed by me and considered in my medical decision making (see chart for details).        Trevor Novak was evaluated in Emergency Department on 03/29/2019 for the symptoms described in the history of present illness. He was evaluated in the context of the global COVID-19 pandemic, which necessitated consideration that the patient might be at risk for infection with the SARS-CoV-2 virus that causes COVID-19. Institutional protocols and algorithms that pertain to the evaluation of patients at risk for COVID-19 are in a state of rapid change based on information released by regulatory bodies including the CDC and federal and state organizations. These policies and algorithms were followed during the patient's care in the ED.  MDM   Covid pending  Final Clinical Impressions(s) / ED Diagnoses   Final diagnoses:  Viral upper respiratory tract infection    ED Discharge Orders    None    An After Visit Summary was printed and given to the patient.    Fransico Meadow, [place] 03/29/19 1058    Lajean Saver, MD 03/29/19 [phone]

## 2019-03-29 NOTE — Discharge Instructions (Signed)
Return if any problems.

## 2019-03-30 ENCOUNTER — Telehealth: Payer: Self-pay | Admitting: Nurse Practitioner

## 2019-03-30 NOTE — Telephone Encounter (Signed)
Discussed with patient about Covid symptoms and the use of bamlanivimab, at this time patient has no symptoms and is overall improved.  Symptoms initially started 12 days ago and he does not qualify for transfusion.

## 2019-04-02 ENCOUNTER — Emergency Department (HOSPITAL_COMMUNITY)
Admission: EM | Admit: 2019-04-02 | Discharge: 2019-04-02 | Disposition: A | Discharge status: Home / Self Care | Payer: Self-pay | Attending: Emergency Medicine | Admitting: Emergency Medicine

## 2019-04-02 ENCOUNTER — Other Ambulatory Visit: Payer: Self-pay

## 2019-04-02 DIAGNOSIS — K0889 Other specified disorders of teeth and supporting structures: Secondary | ICD-10-CM

## 2019-04-02 DIAGNOSIS — F17200 Nicotine dependence, unspecified, uncomplicated: Secondary | ICD-10-CM | POA: Insufficient documentation

## 2019-04-02 DIAGNOSIS — K029 Dental caries, unspecified: Secondary | ICD-10-CM | POA: Insufficient documentation

## 2019-04-02 DIAGNOSIS — J45909 Unspecified asthma, uncomplicated: Secondary | ICD-10-CM | POA: Insufficient documentation

## 2019-04-02 DIAGNOSIS — U071 COVID-19: Secondary | ICD-10-CM | POA: Insufficient documentation

## 2019-04-02 DIAGNOSIS — Z79899 Other long term (current) drug therapy: Secondary | ICD-10-CM | POA: Insufficient documentation

## 2019-04-02 MED ORDER — AMOXICILLIN 500 MG PO CAPS: 500.0000 mg | ORAL_CAPSULE | Freq: Three times a day (TID) | ORAL | 0 refills | Status: DC

## 2019-04-02 NOTE — ED Triage Notes (Signed)
Pt here for evaluation of sore throat after being dx with covid on Friday.

## 2019-04-02 NOTE — ED Provider Notes (Signed)
[facility] EMERGENCY DEPARTMENT Provider Note   CSN: [id number] Arrival date & time: 04/02/19  1547     History   Chief Complaint No chief complaint on file.   HPI Trevor Novak is a 38 y.o. male.     Patient is a 38 year old male with past medical history of asthma presenting to the emergency department for right lower tooth pain.  Patient tested positive for COVID-19 on the 27th of this month.  Reports that he has been getting along just fine without any shortness of breath, fever, nausea, vomiting, diarrhea.  Still has a slight cough.  He was concerned because of the swelling and pain around his tooth and wanted to make sure it was not a side effect of Covid.  He has been eating and drinking normally and not taking any other medication.     Past Medical History:  Diagnosis Date  . Asthma     There are no active problems to display for this patient.   No past surgical history on file.      Home Medications    Prior to Admission medications   Medication Sig Start Date End Date Taking? Authorizing Provider  acetaminophen (TYLENOL) 500 MG tablet Take 1,000 mg by mouth every 4 (four) hours as needed for mild pain, moderate pain or headache.    [provider]  albuterol (VENTOLIN HFA) 108 (90 Base) MCG/ACT inhaler Inhale 2 puffs into the lungs every 4 (four) hours as needed for wheezing or shortness of breath. 02/22/19   Jeannie Fend, PA-C  amoxicillin (AMOXIL) 500 MG capsule Take 1 capsule (500 mg total) by mouth 3 (three) times daily. 04/02/19   Arlyn Dunning, PA-C  clotrimazole (LOTRIMIN) 1 % cream Apply to affected area 2 times daily 11/11/17   Elpidio Anis, PA-C  diphenhydrAMINE (BENADRYL) 25 mg capsule Take 25 mg by mouth every 6 (six) hours as needed for itching or allergies.    [provider]  ibuprofen (ADVIL,MOTRIN) 800 MG tablet Take 1 tablet (800 mg total) by mouth 3 (three) times daily. 08/25/15   Mady Gemma,  PA-C  meloxicam (MOBIC) 15 MG tablet Take 1 tablet (15 mg total) by mouth daily. 10/13/17   Cuthriell, Delorise Royals, PA-C  methocarbamol (ROBAXIN) 500 MG tablet Take 1 tablet (500 mg total) by mouth 2 (two) times daily. 08/25/15   Mady Gemma, PA-C  naproxen (NAPROSYN) 375 MG tablet Take 1 tablet (375 mg total) by mouth 2 (two) times daily. 08/23/15   Audry Pili, PA-C    Family History No family history on file.  Social History Social History   Tobacco Use  . Smoking status: Current Every Day Smoker  . Smokeless tobacco: Never Used  Substance Use Topics  . Alcohol use: Yes    Comment: occasionally   . Drug use: No     Allergies   Patient has no known allergies.   Review of Systems Review of Systems  Constitutional: Negative for activity change, appetite change, chills and fever.  HENT: Positive for dental problem and sore throat. Negative for congestion, ear pain, facial swelling, hearing loss, sinus pain, tinnitus and trouble swallowing.   Respiratory: Positive for cough. Negative for shortness of breath.   Cardiovascular: Negative for chest pain.  Gastrointestinal: Negative for nausea and vomiting.  Genitourinary: Negative for dysuria.  Musculoskeletal: Negative for back pain.  Neurological: Negative for dizziness and headaches.  All other systems reviewed and are negative.    Physical  Exam Updated Vital Signs BP (!) 139/113   Pulse 76   Temp 98.3 F (36.8 C) (Oral)   Resp 16   SpO2 98%   Physical Exam Vitals signs and nursing note reviewed.  Constitutional:      Appearance: Normal appearance.  HENT:     Head: Normocephalic.     Mouth/Throat:     Mouth: Mucous membranes are moist.     Pharynx: Oropharynx is clear. Uvula midline. No pharyngeal swelling, oropharyngeal exudate, posterior oropharyngeal erythema or uvula swelling.     Tonsils: No tonsillar exudate or tonsillar abscesses.     Comments: Patient has poor dentition with caries in the right  lower molars.  He does have surrounding gum erythema and swelling. Eyes:     Conjunctiva/sclera: Conjunctivae normal.  Cardiovascular:     Rate and Rhythm: Normal rate and regular rhythm.  Pulmonary:     Effort: Pulmonary effort is normal.  Abdominal:     General: Abdomen is flat.  Skin:    General: Skin is dry.  Neurological:     General: No focal deficit present.     Mental Status: He is alert.  Psychiatric:        Mood and Affect: Mood normal.      ED Treatments / Results  Labs (all labs ordered are listed, but only abnormal results are displayed) Labs Reviewed - No data to display  EKG None  Radiology No results found.  Procedures Procedures (including critical care time)  Medications Ordered in ED Medications - No data to display   Initial Impression / Assessment and Plan / ED Course  I have reviewed the triage vital signs and the nursing notes.  Pertinent labs & imaging results that were available during my care of the patient were reviewed by me and considered in my medical decision making (see chart for details).  Clinical Course as of Apr 01 1656  Tue Apr 02, 2019  1654 Patient tested positive for COVID-19 from the 27th of this month and presents today for tooth pain and gum swelling.  Appears to have gingivitis and dental caries on exam.  Otherwise appears very well and normal and denies any complications from Covid.  I going to give him a prescription for amoxicillin for his tooth and he was advised that he needs to stay home and quarantine himself and less he is having severe symptoms.  Advised him to drink plenty of fluids and advised him on return precautions   [KM]    Clinical Course User Index [KM] Alveria Apley, PA-C       Based on review of vitals, medical screening exam, lab work and/or imaging, there does not appear to be an acute, emergent etiology for the patient's symptoms. Counseled pt on good return precautions and encouraged both PCP  and ED follow-up as needed.  Prior to discharge, I also discussed incidental imaging findings with patient in detail and advised appropriate, recommended follow-up in detail.  Clinical Impression: 1. Pain, dental     Disposition: Discharge  Prior to providing a prescription for a controlled substance, I independently reviewed the patient's recent prescription history on the [place]. The patient had no recent or regular prescriptions and was deemed appropriate for a brief, less than 3 day prescription of narcotic for acute analgesia.  This note was prepared with assistance of Systems analyst. Occasional wrong-word or sound-a-like substitutions may have occurred due to the inherent limitations of voice  recognition software.   Final Clinical Impressions(s) / ED Diagnoses   Final diagnoses:  Pain, dental    ED Discharge Orders         Ordered    amoxicillin (AMOXIL) 500 MG capsule  3 times daily     04/02/19 1656           Jeral PinchMcLean, Bert Ptacek A, PA-C 04/02/19 1658    Benjiman CorePickering, Nathan, MD 04/04/19 2325

## 2019-04-02 NOTE — ED Notes (Signed)
Pt discharge instructions and prescriptions reviewed with the patient. Pt verbalized understanding of both. Pt discharged. 

## 2019-04-02 NOTE — Discharge Instructions (Addendum)
He was seen today for mouth swelling and pain.  I think that this is coming from your tooth and not from Covid.  I have prescribed you amoxicillin.  Please take this as prescribed.  It is very important that you stay home and do not leave your house unless you are having severe symptoms and need to be evaluated in the emergency department.  If you leave your house you are risking spreading COVID-19 infection to other people.  When you are feeling better and you have had symptoms for more than 10 days and have not had a fever for more than 72 hours then you are able to return out into the public.  At this time you should follow-up with your primary care doctor.  You should follow-up with your primary care doctor in regards to your blood pressure since it has been elevated your last 2 emergency department visits.  We will need to recheck this.

## 2019-04-07 ENCOUNTER — Other Ambulatory Visit: Payer: Self-pay

## 2019-04-07 ENCOUNTER — Encounter (HOSPITAL_COMMUNITY): Payer: Self-pay | Admitting: Emergency Medicine

## 2019-04-07 ENCOUNTER — Emergency Department (HOSPITAL_COMMUNITY)
Admission: EM | Admit: 2019-04-07 | Discharge: 2019-04-07 | Disposition: A | Discharge status: Home / Self Care | Payer: Self-pay | Attending: Emergency Medicine | Admitting: Emergency Medicine

## 2019-04-07 DIAGNOSIS — U071 COVID-19: Secondary | ICD-10-CM | POA: Insufficient documentation

## 2019-04-07 DIAGNOSIS — Z79899 Other long term (current) drug therapy: Secondary | ICD-10-CM | POA: Insufficient documentation

## 2019-04-07 DIAGNOSIS — F17200 Nicotine dependence, unspecified, uncomplicated: Secondary | ICD-10-CM | POA: Insufficient documentation

## 2019-04-07 DIAGNOSIS — J45909 Unspecified asthma, uncomplicated: Secondary | ICD-10-CM | POA: Insufficient documentation

## 2019-04-07 LAB — SARS CORONAVIRUS 2 (TAT 6-24 HRS): SARS Coronavirus 2: POSITIVE — AB

## 2019-04-07 NOTE — ED Triage Notes (Signed)
PT reports he had a + COVID test and no longer has symptoms.  He is requesting another test because he needs a negative result to return to work.

## 2019-04-07 NOTE — ED Provider Notes (Signed)
[facility] EMERGENCY DEPARTMENT Provider Note   CSN: [id number] Arrival date & time: 04/07/19  3254     History   Chief Complaint Chief Complaint  Patient presents with  . COVID +, wants repeat test to return to work    HPI Tremond Strine is a 38 y.o. male with history of asthma     HPI  Patient is 39 year old completely asymptomatic patient presented today for retest since he test positive for COVID-19 previously.  Patient states that a negative test is required for work.  Patient states that his work is not currently excepting CDC guidelines that recommend no retest if patient is asymptomatic for 24 hours and if over 10 days have passed and symptom onset.  Patient states that he has had no fevers chills cough congestion or other symptoms for the last 5 days.  States he is using no medications to ameliorate his symptoms and no antipyretics.  Is breathing well on room air and denies any wheezing.  Past Medical History:  Diagnosis Date  . Asthma     There are no active problems to display for this patient.   History reviewed. No pertinent surgical history.      Home Medications    Prior to Admission medications   Medication Sig Start Date End Date Taking? Authorizing Provider  acetaminophen (TYLENOL) 500 MG tablet Take 1,000 mg by mouth every 4 (four) hours as needed for mild pain, moderate pain or headache.    [provider]  albuterol (VENTOLIN HFA) 108 (90 Base) MCG/ACT inhaler Inhale 2 puffs into the lungs every 4 (four) hours as needed for wheezing or shortness of breath. 02/22/19   Jeannie Fend, PA-C  amoxicillin (AMOXIL) 500 MG capsule Take 1 capsule (500 mg total) by mouth 3 (three) times daily. 04/02/19   Arlyn Dunning, PA-C  clotrimazole (LOTRIMIN) 1 % cream Apply to affected area 2 times daily 11/11/17   Elpidio Anis, PA-C  diphenhydrAMINE (BENADRYL) 25 mg capsule Take 25 mg by mouth every 6 (six) hours as needed for itching or  allergies.    [provider]  ibuprofen (ADVIL,MOTRIN) 800 MG tablet Take 1 tablet (800 mg total) by mouth 3 (three) times daily. 08/25/15   Mady Gemma, PA-C  meloxicam (MOBIC) 15 MG tablet Take 1 tablet (15 mg total) by mouth daily. 10/13/17   Cuthriell, Delorise Royals, PA-C  methocarbamol (ROBAXIN) 500 MG tablet Take 1 tablet (500 mg total) by mouth 2 (two) times daily. 08/25/15   Mady Gemma, PA-C  naproxen (NAPROSYN) 375 MG tablet Take 1 tablet (375 mg total) by mouth 2 (two) times daily. 08/23/15   Audry Pili, PA-C    Family History No family history on file.  Social History Social History   Tobacco Use  . Smoking status: Current Every Day Smoker  . Smokeless tobacco: Never Used  Substance Use Topics  . Alcohol use: Yes    Comment: occasionally   . Drug use: No     Allergies   Patient has no known allergies.   Review of Systems Review of Systems  Constitutional: Negative for chills and fever.  HENT: Negative for congestion, ear pain, postnasal drip, rhinorrhea, sinus pain and sore throat.   Eyes: Negative for pain and redness.  Respiratory: Negative for cough.   Cardiovascular: Negative for chest pain.  Gastrointestinal: Negative for abdominal pain.  Musculoskeletal: Negative for neck pain.  Skin: Negative for rash.  Neurological: Negative for dizziness and headaches.  Psychiatric/Behavioral: Negative for sleep disturbance.     Physical Exam Updated Vital Signs BP (!) 159/103 (BP Location: Left Arm)   Pulse 82   Temp 98.4 F (36.9 C) (Oral)   Resp 18   SpO2 100%   Physical Exam Vitals signs and nursing note reviewed.  Constitutional:      General: He is not in acute distress. HENT:     Head: Normocephalic and atraumatic.     Right Ear: External ear normal.     Left Ear: External ear normal.     Nose: Nose normal. No congestion.     Mouth/Throat:     Mouth: Mucous membranes are moist.     Pharynx: No oropharyngeal exudate or  posterior oropharyngeal erythema.  Eyes:     General: No scleral icterus.       Right eye: No discharge.        Left eye: No discharge.     Conjunctiva/sclera: Conjunctivae normal.  Cardiovascular:     Rate and Rhythm: Normal rate and regular rhythm.  Pulmonary:     Effort: Pulmonary effort is normal. No respiratory distress.     Breath sounds: Normal breath sounds. No wheezing or rales.  Chest:     Chest wall: No tenderness.  Abdominal:     Palpations: Abdomen is soft.     Tenderness: There is no abdominal tenderness.  Lymphadenopathy:     Cervical: No cervical adenopathy.  Skin:    General: Skin is warm and dry.  Neurological:     Mental Status: He is alert. Mental status is at baseline.  Psychiatric:        Mood and Affect: Mood normal.        Behavior: Behavior normal.      ED Treatments / Results  Labs (all labs ordered are listed, but only abnormal results are displayed) Labs Reviewed  SARS CORONAVIRUS 2 (TAT 6-24 HRS)    EKG None  Radiology No results found.  Procedures Procedures (including critical care time)  Medications Ordered in ED Medications - No data to display   Initial Impression / Assessment and Plan / ED Course  I have reviewed the triage vital signs and the nursing notes.  Pertinent labs & imaging results that were available during my care of the patient were reviewed by me and considered in my medical decision making (see chart for details).       Is healthy 38 year old male presented today for Covid retest.  Patient states he is unable to work without positive test I am restarting patient and providing him with multiple notes stating that he meets criteria for discontinuation of home isolation per CDC guidelines to give to his work in order to allow him to return without getting a second negative test which is currently his employer's recommendation and requirement.   Final Clinical Impressions(s) / ED Diagnoses   Final diagnoses:   [id number]    ED Discharge Orders    None       Tedd Sias, [place] 04/07/19 6553    Wyvonnia Dusky, MD 04/07/19 2147

## 2019-04-07 NOTE — Discharge Instructions (Signed)
You may return to work.

## 2019-04-12 ENCOUNTER — Ambulatory Visit (HOSPITAL_COMMUNITY)
Admission: EM | Admit: 2019-04-12 | Discharge: 2019-04-12 | Disposition: A | Discharge status: Home / Self Care | Payer: Self-pay | Attending: Family Medicine | Admitting: Family Medicine

## 2019-04-12 NOTE — ED Notes (Signed)
Pt was here for a return to work note stating he can return without having a negative Covid test.  Trevor Gamble, NP printed a note with the CDC guidelines attached.  Pt was completely asymptomatic and has not had a fever for atleast 24 hours without medications and he has not quarantined for 14 since start of symptoms due to two positive tests since his initial test on 03/29/2019.

## 2019-07-26 ENCOUNTER — Ambulatory Visit (HOSPITAL_COMMUNITY)
Admission: EM | Admit: 2019-07-26 | Discharge: 2019-07-26 | Disposition: A | Discharge status: Home / Self Care | Payer: BC Managed Care – PPO | Attending: Physician Assistant | Admitting: Physician Assistant

## 2019-07-26 ENCOUNTER — Other Ambulatory Visit: Payer: Self-pay

## 2019-07-26 ENCOUNTER — Encounter (HOSPITAL_COMMUNITY): Payer: Self-pay

## 2019-07-26 DIAGNOSIS — G5611 Other lesions of median nerve, right upper limb: Secondary | ICD-10-CM

## 2019-07-26 DIAGNOSIS — G5612 Other lesions of median nerve, left upper limb: Secondary | ICD-10-CM | POA: Diagnosis not present

## 2019-07-26 MED ORDER — PREDNISONE 10 MG PO TABS: 20.0000 mg | ORAL_TABLET | Freq: Every day | ORAL | 0 refills | Status: AC

## 2019-07-26 MED ORDER — PREDNISONE 10 MG PO TABS: 20.0000 mg | ORAL_TABLET | Freq: Every day | ORAL | 0 refills | Status: DC

## 2019-07-26 NOTE — ED Provider Notes (Signed)
[place] [facility]    CSN: [id number] Arrival date & time: 07/26/19  1052      History   Chief Complaint Chief Complaint  Patient presents with  . arm/hand numbness    HPI Trevor Novak is a 39 y.o. male.   She reports for 2-week complaint of numbness and tingling in his hands.  His primary complaint is with his right hand.  Reports nighttime numbness and tingling and sensation of and falling asleep in his thumb, pointer and middle fingers and hand in these areas.  The sensation does radiate up into his forearm.  He reports this is been getting worse over the last 2 weeks.  Says primarily happens at night and does wake him.  He has similar similar symptoms starting in his left but not as severe.  He denies weakness in either hand..  He denies injury.  Denies neck pain or neck injury.  Denies symptoms above the elbow.  Denies numbness, tingling or weakness anywhere else.  Denies recent headaches.  Works as a Nature conservation officer doing repetitive work.  He has not seen anybody or tried any medications for this.     Past Medical History:  Diagnosis Date  . Asthma     There are no problems to display for this patient.   History reviewed. No pertinent surgical history.     Home Medications    Prior to Admission medications   Medication Sig Start Date End Date Taking? Authorizing Provider  albuterol (VENTOLIN HFA) 108 (90 Base) MCG/ACT inhaler Inhale 2 puffs into the lungs every 4 (four) hours as needed for wheezing or shortness of breath. 02/22/19  Yes Tacy Learn, PA-C  acetaminophen (TYLENOL) 500 MG tablet Take 1,000 mg by mouth every 4 (four) hours as needed for mild pain, moderate pain or headache.    [provider]  amoxicillin (AMOXIL) 500 MG capsule Take 1 capsule (500 mg total) by mouth 3 (three) times daily. 04/02/19   Alveria Apley, PA-C  clotrimazole (LOTRIMIN) 1 % cream Apply to affected area 2 times daily 11/11/17   Charlann Lange, PA-C    diphenhydrAMINE (BENADRYL) 25 mg capsule Take 25 mg by mouth every 6 (six) hours as needed for itching or allergies.    [provider]  ibuprofen (ADVIL,MOTRIN) 800 MG tablet Take 1 tablet (800 mg total) by mouth 3 (three) times daily. 08/25/15   Marella Chimes, PA-C  meloxicam (MOBIC) 15 MG tablet Take 1 tablet (15 mg total) by mouth daily. 10/13/17   Cuthriell, Charline Bills, PA-C  methocarbamol (ROBAXIN) 500 MG tablet Take 1 tablet (500 mg total) by mouth 2 (two) times daily. 08/25/15   Marella Chimes, PA-C  naproxen (NAPROSYN) 375 MG tablet Take 1 tablet (375 mg total) by mouth 2 (two) times daily. 08/23/15   Shary Decamp, PA-C  predniSONE (DELTASONE) 10 MG tablet Take 2 tablets (20 mg total) by mouth daily for 7 days. 07/26/19 08/02/19  Asmi Fugere, Marguerita Beards, PA-C    Family History Family History  Problem Relation Age of Onset  . Heart failure Mother   . Heart failure Father   . Hypertension Father   . Diabetes Father     Social History Social History   Tobacco Use  . Smoking status: Current Every Day Smoker  . Smokeless tobacco: Never Used  Substance Use Topics  . Alcohol use: Yes    Comment: occasionally   . Drug use: No     Allergies   Patient  has no known allergies.   Review of Systems Review of Systems  Skin: Negative for rash.  Neurological: Positive for numbness. Negative for tremors, seizures, facial asymmetry, weakness and headaches.     Physical Exam Triage Vital Signs ED Triage Vitals  Enc Vitals Group     BP 07/26/19 1138 (!) 152/99     Pulse Rate 07/26/19 1138 95     Resp 07/26/19 1138 18     Temp 07/26/19 1138 98.3 F (36.8 C)     Temp Source 07/26/19 1138 Oral     SpO2 07/26/19 1138 96 %     Weight --      Height --      Head Circumference --      Peak Flow --      Pain Score 07/26/19 1135 0     Pain Loc --      Pain Edu? --      Excl. in GC? --    No data found.  Updated Vital Signs BP (!) 152/99 (BP Location: Left Arm)    Pulse 95   Temp 98.3 F (36.8 C) (Oral)   Resp 18   SpO2 96%   Visual Acuity Right Eye Distance:   Left Eye Distance:   Bilateral Distance:    Right Eye Near:   Left Eye Near:    Bilateral Near:     Physical Exam Vitals and nursing note reviewed.  Constitutional:      General: He is not in acute distress.    Appearance: He is well-developed.  HENT:     Head: Normocephalic and atraumatic.  Eyes:     Conjunctiva/sclera: Conjunctivae normal.  Cardiovascular:     Rate and Rhythm: Normal rate.     Heart sounds: No murmur.  Pulmonary:     Effort: Pulmonary effort is normal. No respiratory distress.  Musculoskeletal:     Cervical back: Neck supple.     Comments: Good muscle bulk with no evidence of thenar atrophy in either hand.  There is no atrophy of the forearm muscles.  Patient has good grip strength equal bilaterally.  Is full range of motion at the wrist.  There is equal sensation bilaterally across all dermatomes of the hand.  Equal sensation with light touch.  Good discrimination between light and sharp sensation.  Unable to elicit symptoms with Tinel's or Phalen's.  Symptoms are not elicited with tapping at the medial or lateral epicondyles.  Cervical spine with full range of motion and no tenderness palpation.  Symptoms are not elicited with compression of the cervical spine.  Skin:    General: Skin is warm and dry.  Neurological:     General: No focal deficit present.     Mental Status: He is alert and oriented to person, place, and time.      UC Treatments / Results  Labs (all labs ordered are listed, but only abnormal results are displayed) Labs Reviewed - No data to display  EKG   Radiology No results found.  Procedures Procedures (including critical care time)  Medications Ordered in UC Medications - No data to display  Initial Impression / Assessment and Plan / UC Course  I have reviewed the triage vital signs and the nursing notes.  Pertinent  labs & imaging results that were available during my care of the patient were reviewed by me and considered in my medical decision making (see chart for details).     #Median nerve neuropathies Patient 39 year old presenting with  neuropathy consistent with median nerve distribution.  Unclear whether this is focalized to carpal entrapment versus cubital entrapment.  Considering there are no symptoms above the elbow or at the neck do not believe these and can be localized to the brachial plexus or the cervical spine at this time.  Discussed with patient that this is likely carpal tunnel and that we can try a brace primarily on the right side and have him follow-up with orthopedics.  We will also trial short course of prednisone.  Patient agrees to this plan.  Final Clinical Impressions(s) / UC Diagnoses   Final diagnoses:  Median nerve neuropathy, right  Median nerve neuropathy, left     Discharge Instructions     Use the wrist brace at night and during the day when not at work or while working if possible.  Take the prednisone for 7 days.  You may take ibuprofen once you have finished the prednisone.  Please follow-up with the primary care and orthopedics office    ED Prescriptions    Medication Sig Dispense Auth. Provider   predniSONE (DELTASONE) 10 MG tablet  (Status: Discontinued) Take 2 tablets (20 mg total) by mouth daily for 10 days. 20 tablet Nellene Courtois, Veryl Speak, PA-C   predniSONE (DELTASONE) 10 MG tablet Take 2 tablets (20 mg total) by mouth daily for 7 days. 14 tablet Stepehn Eckard, Veryl Speak, PA-C     PDMP not reviewed this encounter.   Hermelinda Medicus, PA-C 07/27/19 [phone]

## 2019-07-26 NOTE — Discharge Instructions (Addendum)
Use the wrist brace at night and during the day when not at work or while working if possible.  Take the prednisone for 7 days.  You may take ibuprofen once you have finished the prednisone.  Please follow-up with the primary care and orthopedics office

## 2019-07-26 NOTE — ED Triage Notes (Signed)
Pt c/o right hand numbness to first three fingers, up through wrist and forearm area that is worse at night for approx 2 weeks. Pt states left hand is now starting to experience same symptoms of mild numbness to first three fingers. Denies dizziness, blurred vision, difficulty ambulating or other neurological problem.  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

## 2019-09-21 ENCOUNTER — Emergency Department (HOSPITAL_COMMUNITY)
Admission: EM | Admit: 2019-09-21 | Discharge: 2019-09-21 | Disposition: A | Discharge status: Home / Self Care | Payer: BC Managed Care – PPO | Attending: Emergency Medicine | Admitting: Emergency Medicine

## 2019-09-21 ENCOUNTER — Encounter (HOSPITAL_COMMUNITY): Payer: Self-pay | Admitting: Emergency Medicine

## 2019-09-21 DIAGNOSIS — K029 Dental caries, unspecified: Secondary | ICD-10-CM | POA: Diagnosis not present

## 2019-09-21 DIAGNOSIS — F17210 Nicotine dependence, cigarettes, uncomplicated: Secondary | ICD-10-CM | POA: Insufficient documentation

## 2019-09-21 DIAGNOSIS — K0889 Other specified disorders of teeth and supporting structures: Secondary | ICD-10-CM | POA: Diagnosis not present

## 2019-09-21 DIAGNOSIS — Z760 Encounter for issue of repeat prescription: Secondary | ICD-10-CM | POA: Diagnosis not present

## 2019-09-21 DIAGNOSIS — Z8709 Personal history of other diseases of the respiratory system: Secondary | ICD-10-CM | POA: Diagnosis not present

## 2019-09-21 MED ORDER — ALBUTEROL SULFATE HFA 108 (90 BASE) MCG/ACT IN AERS
1.0000 | INHALATION_SPRAY | Freq: Once | RESPIRATORY_TRACT | Status: DC
  Filled 2019-09-21: qty 6.7

## 2019-09-21 MED ORDER — PENICILLIN V POTASSIUM 500 MG PO TABS: 500.0000 mg | ORAL_TABLET | Freq: Four times a day (QID) | ORAL | 0 refills | Status: AC

## 2019-09-21 MED ORDER — IBUPROFEN 600 MG PO TABS: 600.0000 mg | ORAL_TABLET | Freq: Four times a day (QID) | ORAL | 0 refills | Status: DC | PRN

## 2019-09-21 NOTE — ED Triage Notes (Signed)
Pt. Stated, I have a bad upper right tooth I think has broke off. It started yesterday

## 2019-09-21 NOTE — Discharge Instructions (Signed)
Start Penicillin - take 4 times daily with food Take Ibuprofen 600mg  as needed for pain and inflammation Please follow up with Dr. (dentist)

## 2019-09-21 NOTE — ED Provider Notes (Signed)
[facility] EMERGENCY DEPARTMENT Provider Note   CSN: [id number] Arrival date & time: 09/21/19  0801   History Chief Complaint  Patient presents with  . Dental Pain    Trevor Novak is a 39 y.o. male with history of asthma who presents with dental pain. He states that he has had some swelling of the gums and left upper dental pain. The pain started yesterday. He has a fractured left upper molar that's been present for a while. He denies any oral swelling, fever. He reports some SOB which he attributes to asthma flare and being out of his inhaler. He does not have a dentist. He has not taken anything for pain.  HPI     Past Medical History:  Diagnosis Date  . Asthma     There are no problems to display for this patient.   History reviewed. No pertinent surgical history.     Family History  Problem Relation Age of Onset  . Heart failure Mother   . Heart failure Father   . Hypertension Father   . Diabetes Father     Social History   Tobacco Use  . Smoking status: Current Every Day Smoker  . Smokeless tobacco: Never Used  Substance Use Topics  . Alcohol use: Yes    Comment: occasionally   . Drug use: No    Home Medications Prior to Admission medications   Medication Sig Start Date End Date Taking? Authorizing Provider  acetaminophen (TYLENOL) 500 MG tablet Take 1,000 mg by mouth every 4 (four) hours as needed for mild pain, moderate pain or headache.    [provider]  albuterol (VENTOLIN HFA) 108 (90 Base) MCG/ACT inhaler Inhale 2 puffs into the lungs every 4 (four) hours as needed for wheezing or shortness of breath. 02/22/19   Tacy Learn, PA-C  amoxicillin (AMOXIL) 500 MG capsule Take 1 capsule (500 mg total) by mouth 3 (three) times daily. 04/02/19   Alveria Apley, PA-C  clotrimazole (LOTRIMIN) 1 % cream Apply to affected area 2 times daily 11/11/17   Charlann Lange, PA-C  diphenhydrAMINE (BENADRYL) 25 mg capsule Take 25 mg by  mouth every 6 (six) hours as needed for itching or allergies.    [provider]  ibuprofen (ADVIL) 600 MG tablet Take 1 tablet (600 mg total) by mouth every 6 (six) hours as needed. 09/21/19   Recardo Evangelist, PA-C  meloxicam (MOBIC) 15 MG tablet Take 1 tablet (15 mg total) by mouth daily. 10/13/17   Cuthriell, Charline Bills, PA-C  methocarbamol (ROBAXIN) 500 MG tablet Take 1 tablet (500 mg total) by mouth 2 (two) times daily. 08/25/15   Marella Chimes, PA-C  naproxen (NAPROSYN) 375 MG tablet Take 1 tablet (375 mg total) by mouth 2 (two) times daily. 08/23/15   Shary Decamp, PA-C  penicillin v potassium (VEETID) 500 MG tablet Take 1 tablet (500 mg total) by mouth 4 (four) times daily for 7 days. 09/21/19 09/28/19  Recardo Evangelist, PA-C    Allergies    Patient has no known allergies.  Review of Systems   Review of Systems  Constitutional: Negative for fever.  HENT: Positive for dental problem.   Respiratory: Positive for shortness of breath.     Physical Exam Updated Vital Signs BP (!) 136/103 (BP Location: Left Arm)   Pulse 79   Temp 98.6 F (37 C) (Oral)   Resp 16   Ht 5\' 4"  (1.626 m)   Wt 90.7 kg  SpO2 98%   BMI 34.33 kg/m   Physical Exam Vitals and nursing note reviewed.  Constitutional:      General: He is not in acute distress.    Appearance: Normal appearance. He is well-developed. He is not ill-appearing.  HENT:     Head: Normocephalic and atraumatic.     Mouth/Throat:     Lips: Pink.     Mouth: Mucous membranes are moist.     Dentition: Abnormal dentition. Dental caries present. No dental tenderness.   Eyes:     General: No scleral icterus.       Right eye: No discharge.        Left eye: No discharge.     Conjunctiva/sclera: Conjunctivae normal.     Pupils: Pupils are equal, round, and reactive to light.  Cardiovascular:     Rate and Rhythm: Normal rate.  Pulmonary:     Effort: Pulmonary effort is normal. No respiratory distress.  Abdominal:       General: There is no distension.  Musculoskeletal:     Cervical back: Normal range of motion.  Skin:    General: Skin is warm and dry.  Neurological:     Mental Status: He is alert and oriented to person, place, and time.  Psychiatric:        Behavior: Behavior normal.     ED Results / Procedures / Treatments   Labs (all labs ordered are listed, but only abnormal results are displayed) Labs Reviewed - No data to display  EKG None  Radiology No results found.  Procedures Procedures (including critical care time)  Medications Ordered in ED Medications  albuterol (VENTOLIN HFA) 108 (90 Base) MCG/ACT inhaler 1-2 puff (has no administration in time range)    ED Course  I have reviewed the triage vital signs and the nursing notes.  Pertinent labs & imaging results that were available during my care of the patient were reviewed by me and considered in my medical decision making (see chart for details).  39 year old male presents with left upper dental pain with gingival swelling for one day. HE is afebrile, non toxic appearing, and swallowing secretions well. No concerning findings on exam. No obvious drainable abscess and doubt deep space head or neck infection. Will tx with antibiotics and NSAIDs. I gave patient referral to dentist and stressed the importance of dental follow up for ultimate management of dental pain. He is also requesting albuterol inhaler which was given - he was encouraged to quit smoking.  MDM Rules/Calculators/A&P                       Final Clinical Impression(s) / ED Diagnoses Final diagnoses:  Pain, dental  Medication refill    Rx / DC Orders ED Discharge Orders         Ordered    penicillin v potassium (VEETID) 500 MG tablet  4 times daily     09/21/19 0828    ibuprofen (ADVIL) 600 MG tablet  Every 6 hours PRN     09/21/19 0829           Bethel Born, PA-C 09/21/19 0848    Gerhard Munch, MD 09/21/19 1004

## 2020-02-22 ENCOUNTER — Encounter (HOSPITAL_COMMUNITY): Payer: Self-pay | Admitting: Emergency Medicine

## 2020-02-22 ENCOUNTER — Emergency Department (HOSPITAL_COMMUNITY)
Admission: EM | Admit: 2020-02-22 | Discharge: 2020-02-23 | Disposition: A | Discharge status: Home / Self Care | Payer: BC Managed Care – PPO | Attending: Emergency Medicine | Admitting: Emergency Medicine

## 2020-02-22 DIAGNOSIS — F17200 Nicotine dependence, unspecified, uncomplicated: Secondary | ICD-10-CM | POA: Insufficient documentation

## 2020-02-22 DIAGNOSIS — K0889 Other specified disorders of teeth and supporting structures: Secondary | ICD-10-CM | POA: Diagnosis present

## 2020-02-22 DIAGNOSIS — K029 Dental caries, unspecified: Secondary | ICD-10-CM | POA: Insufficient documentation

## 2020-02-22 NOTE — ED Triage Notes (Signed)
Pt c/o R lower toothache and cold sore. Known broken tooth in area. Pt took tylenol PTA

## 2020-02-23 MED ORDER — PENICILLIN V POTASSIUM 250 MG PO TABS
500.0000 mg | ORAL_TABLET | Freq: Once | ORAL | Status: AC
  Administered 2020-02-23: 500 mg via ORAL
  Filled 2020-02-23: qty 2

## 2020-02-23 MED ORDER — IBUPROFEN 400 MG PO TABS
600.0000 mg | ORAL_TABLET | Freq: Once | ORAL | Status: AC
  Administered 2020-02-23: 600 mg via ORAL
  Filled 2020-02-23: qty 1

## 2020-02-23 MED ORDER — PENICILLIN V POTASSIUM 500 MG PO TABS: 500.0000 mg | ORAL_TABLET | Freq: Four times a day (QID) | ORAL | 0 refills | Status: AC

## 2020-02-23 MED ORDER — IBUPROFEN 600 MG PO TABS: 600.0000 mg | ORAL_TABLET | Freq: Four times a day (QID) | ORAL | 0 refills | Status: DC | PRN

## 2020-02-23 NOTE — ED Provider Notes (Signed)
[facility] EMERGENCY DEPARTMENT Provider Note   CSN: [id number] Arrival date & time: 02/22/20  2054     History Chief Complaint  Patient presents with  . Dental Pain    Trevor Novak is a 39 y.o. male.  Patient to ED with complaint of pain in right lower tooth that has been broken for some time. Pain x 2 days. No fever, facial swelling or difficulty swallowing. He reports the tooth pain started after developing a cold sore on his right lateral upper lip.   The history is provided by the patient. No language interpreter was used.  Dental Pain Associated symptoms: oral lesions   Associated symptoms: no facial swelling, no fever and no headaches        Past Medical History:  Diagnosis Date  . Asthma     There are no problems to display for this patient.   History reviewed. No pertinent surgical history.     Family History  Problem Relation Age of Onset  . Heart failure Mother   . Heart failure Father   . Hypertension Father   . Diabetes Father     Social History   Tobacco Use  . Smoking status: Current Every Day Smoker  . Smokeless tobacco: Never Used  Vaping Use  . Vaping Use: Never used  Substance Use Topics  . Alcohol use: Yes    Comment: occasionally   . Drug use: No    Home Medications Prior to Admission medications   Medication Sig Start Date End Date Taking? Authorizing Provider  acetaminophen (TYLENOL) 500 MG tablet Take 1,000 mg by mouth every 4 (four) hours as needed for mild pain, moderate pain or headache.    [provider]  albuterol (VENTOLIN HFA) 108 (90 Base) MCG/ACT inhaler Inhale 2 puffs into the lungs every 4 (four) hours as needed for wheezing or shortness of breath. 02/22/19   Jeannie Fend, PA-C  amoxicillin (AMOXIL) 500 MG capsule Take 1 capsule (500 mg total) by mouth 3 (three) times daily. 04/02/19   Arlyn Dunning, PA-C  clotrimazole (LOTRIMIN) 1 % cream Apply to affected area 2 times daily 11/11/17    Elpidio Anis, PA-C  diphenhydrAMINE (BENADRYL) 25 mg capsule Take 25 mg by mouth every 6 (six) hours as needed for itching or allergies.    [provider]  ibuprofen (ADVIL) 600 MG tablet Take 1 tablet (600 mg total) by mouth every 6 (six) hours as needed. 09/21/19   Bethel Born, PA-C  meloxicam (MOBIC) 15 MG tablet Take 1 tablet (15 mg total) by mouth daily. 10/13/17   Cuthriell, Delorise Royals, PA-C  methocarbamol (ROBAXIN) 500 MG tablet Take 1 tablet (500 mg total) by mouth 2 (two) times daily. 08/25/15   Mady Gemma, PA-C  naproxen (NAPROSYN) 375 MG tablet Take 1 tablet (375 mg total) by mouth 2 (two) times daily. 08/23/15   Audry Pili, PA-C    Allergies    Patient has no known allergies.  Review of Systems   Review of Systems  Constitutional: Negative for fever.  HENT: Positive for dental problem and mouth sores. Negative for facial swelling and trouble swallowing.   Respiratory: Negative for cough.   Gastrointestinal: Negative for nausea.  Musculoskeletal: Negative for myalgias.  Neurological: Negative for weakness and headaches.    Physical Exam Updated Vital Signs BP (!) 141/100 (BP Location: Right Arm)   Pulse 70   Temp 98.3 F (36.8 C) (Oral)   Resp 18  SpO2 98%   Physical Exam Constitutional:      Appearance: He is well-developed.  HENT:     Mouth/Throat:     Comments: Partially edentulous. #29 has severe decay. No surrounding swelling or redness. No facial swelling. Oropharynx benign. Resolving sore to upper right lip c/w cold sore.  Pulmonary:     Effort: Pulmonary effort is normal.  Musculoskeletal:        General: Normal range of motion.     Cervical back: Normal range of motion.  Skin:    General: Skin is warm and dry.  Neurological:     Mental Status: He is alert and oriented to person, place, and time.     ED Results / Procedures / Treatments   Labs (all labs ordered are listed, but only abnormal results are displayed) Labs  Reviewed - No data to display  EKG None  Radiology No results found.  Procedures Procedures (including critical care time)  Medications Ordered in ED Medications  penicillin v potassium (VEETID) tablet 500 mg (has no administration in time range)  ibuprofen (ADVIL) tablet 600 mg (has no administration in time range)    ED Course  I have reviewed the triage vital signs and the nursing notes.  Pertinent labs & imaging results that were available during my care of the patient were reviewed by me and considered in my medical decision making (see chart for details).    MDM Rules/Calculators/A&P                          Patient to ED for treatment of dental pain x 2 days to a tooth known to be broken. No fever, facial swelling.  There is significant decay of #29. Will start on PCN, ibuprofen and refer to dentistry.   Final Clinical Impression(s) / ED Diagnoses Final diagnoses:  None   1. Dental pain 2. Dental decay  Rx / DC Orders ED Discharge Orders    None       Elpidio Anis, PA-C 02/23/20 0034    Rolan Bucco, MD 02/23/20 [phone]

## 2020-02-23 NOTE — Discharge Instructions (Addendum)
Follow up with dentistry to further treatment of dental decay.

## 2020-08-05 ENCOUNTER — Encounter: Payer: Self-pay | Admitting: Student

## 2020-08-05 ENCOUNTER — Other Ambulatory Visit: Payer: Self-pay

## 2020-08-05 ENCOUNTER — Ambulatory Visit (INDEPENDENT_AMBULATORY_CARE_PROVIDER_SITE_OTHER): Payer: 59 | Admitting: Student

## 2020-08-05 VITALS — BP 126/86 | HR 94 | Temp 98.3°F | Wt 219.5 lb

## 2020-08-05 DIAGNOSIS — R635 Abnormal weight gain: Secondary | ICD-10-CM | POA: Diagnosis not present

## 2020-08-05 DIAGNOSIS — J4540 Moderate persistent asthma, uncomplicated: Secondary | ICD-10-CM

## 2020-08-05 DIAGNOSIS — E669 Obesity, unspecified: Secondary | ICD-10-CM

## 2020-08-05 DIAGNOSIS — R5383 Other fatigue: Secondary | ICD-10-CM | POA: Diagnosis not present

## 2020-08-05 MED ORDER — DEXAMETHASONE 1 MG PO TABS: 1.0000 mg | ORAL_TABLET | Freq: Once | ORAL | 0 refills | Status: AC

## 2020-08-05 MED ORDER — ADVAIR HFA 115-21 MCG/ACT IN AERO: 2.0000 | INHALATION_SPRAY | Freq: Two times a day (BID) | RESPIRATORY_TRACT | 12 refills | Status: DC

## 2020-08-05 NOTE — Patient Instructions (Signed)
Thank you, [name] for allowing [place] to provide your care today. Today we discussed your weight gain, fatigue and asthma.  We are starting you on a daily asthma medication.  We are also checking for medical problem call cushings syndrome.   I have ordered the following labs for you:   Lab Orders     Cortisol-am, blood     CMP14 + Anion Gap     CBC with Diff     TSH     Lipid Profile   I will call if any are abnormal. All of your labs can be accessed through "My Chart".   I have ordered the following medication/changed the following medications:  1. Dexamethasone 1 mg, take at 11 PM the day before lab work at 8 AM. 2. Started Advair Diskus 2 puffs daily  Please follow-up in 2 weeks.  Should you have any questions or concerns please call the internal medicine clinic at [phone].    Sharrell Ku, MD, MPH [facility]   My Chart Access: [email or web address]   If you have not already done so, please get your COVID 19 vaccine  To schedule an appointment for a COVID vaccine choice any of the following: Go to [email or web address]   Go to [email or web address]                  Call [phone]                                     Call [phone] and select Option 2

## 2020-08-05 NOTE — Progress Notes (Signed)
   CC: Establish Care  HPI:  [name] is a 40 y.o. M with PMH as below who presents today to clinic to establish care. Please see problem based charting for evaluation, assessment and plan.  Past Medical History:  Diagnosis Date  . Asthma    Medications: PRN Albuterol  Allergies: NKDA  Family History: Hx of HTN, CHF in mother and father. Hx of diabetes in father.   Social History: Moved to [place] with his family from [place] to [place] about 5 years ago. He has a 3-yo son with his wife named Jackelyn Hoehn. He started working for [employer] here at [employer] [facility] [employer] and works 3rd shift. He enjoys fishing in his spare time. He eats normal American diet and states he has been eating less now that he works at night. He does not exercise. He has been smoking 1/2 ppd of cigarettes since he was 19. He is an occasional alcohol drinker. He does any illicit drug use currently but used marijuana years ago.   Review of Systems:  Constitutional: Positive for weight gain and fatigue. Negative for fever Eyes: Negative for visual changes Respiratory: Positive for shortness of breath. Negative for wheezing or cough. Cardiac: Negative for chest pain MSK: Negative for back pain Abdomen: Negative for abdominal pain, constipation or diarrhea GU: Negative for dysuria or hematuria Neuro: Negative for headache, numbness or weakness  Physical Exam: General: Pleasant, obese, well-appearing male. No acute distress. HEENT: MMM. EOMI. PERRLA. Neck: Supple. No thyromegaly or lymphadenopathy. No JVD. Cardiac: Borderline tachycardia. Regular Rhythm. No murmurs, rubs or gallops. No LE edema Respiratory: Lungs CTAB. No wheezing or crackles. Abdominal: Soft, symmetric and non tender. Normal BS. Central obesity with tympany.  Skin: Warm, dry and intact without rashes or lesions. Stretch marks on lower abdomen.  Extremities: Atraumatic. Full ROM. Pulse palpable. Neuro: A&O x 3. Moves all extremities. Normal  sensation. Psych: Appropriate mood and affect.  Vitals:   08/05/20 0838  BP: 126/86  Pulse: 94  Temp: 98.3 F (36.8 C)  TempSrc: Oral  SpO2: 98%  Weight: 219 lb 8 oz (99.6 kg)    Assessment & Plan:   See Encounters Tab for problem based charting.  Patient discussed with Dr. Elon Spanner, MD, MPH

## 2020-08-06 ENCOUNTER — Other Ambulatory Visit: Payer: 59

## 2020-08-06 ENCOUNTER — Encounter: Payer: Self-pay | Admitting: Student

## 2020-08-06 DIAGNOSIS — J45909 Unspecified asthma, uncomplicated: Secondary | ICD-10-CM | POA: Insufficient documentation

## 2020-08-06 DIAGNOSIS — R635 Abnormal weight gain: Secondary | ICD-10-CM

## 2020-08-06 DIAGNOSIS — J4540 Moderate persistent asthma, uncomplicated: Secondary | ICD-10-CM

## 2020-08-06 DIAGNOSIS — R5383 Other fatigue: Secondary | ICD-10-CM | POA: Insufficient documentation

## 2020-08-06 DIAGNOSIS — E669 Obesity, unspecified: Secondary | ICD-10-CM | POA: Insufficient documentation

## 2020-08-06 NOTE — Assessment & Plan Note (Signed)
Patient presents today to establish care. He reports increased fatigue over the last few years. He reports getting enough sleep even after starting 3rd shift work at [employer] 3 months ago. He endorse daily SOB due to his asthma. He endorse weight gain with most of the weight around his abdomen and but denies any fevers, weakness, visual changes, LE swelling, CP, abd pain or back pain. On exam, there is no lymphadenopathy, JVD, thyromegaly or tenderness to palpation of the abdomen. Patient does have central obesity with stretch marks on lower abdomen. Differential for patient's presentation includes hypothyroidism, excessive calorie intake, OSA, cushing's disease and HF. Will start with initial labs and assess for cushing's disease with low dose dexamethasone suppression test.   Plan: --Low dose dexamethasone suppression test, 1 mg Decadron once at 11 pm followed by morning cortisol --Follow up morning cortisol --Follow up CBC, CMP --Follow up lipid panel, A1c  --Follow up TSH --Advised to exercise at least 30 min a day and reduce carb intake. --Consider work up for OSA and HF if initial work up for cushing or hypothyroidism are negative. --Follow up in 2 weeks

## 2020-08-06 NOTE — Progress Notes (Signed)
Internal Medicine Clinic Attending  Case discussed with Dr. Amponsah at the time of the visit.  We reviewed the resident's history and exam and pertinent patient test results.  I agree with the assessment, diagnosis, and plan of care documented in the resident's note.  Alisah Grandberry, M.D., Ph.D.  

## 2020-08-06 NOTE — Assessment & Plan Note (Signed)
Patient states he was diagnosed with Asthma as a child. It is only managed with PRN albuterol. Patient states that over the last few years, he has had to use the albuterol daily for his symptoms. States he gets out of breath sometimes after he eats but denies any nocturnal awakening due to SOB. He denies cough but endorse occasional limited activity due to SOB. He is often able to get enough sleep but feels tired throughout the day. He denies PND, chest pain, palpitations or dyspnea on exertion.   Plan: --Continue PRN albuterol as a rescue inhaler --Start Advair HFA 115-21, 2 puff twice daily --Will consider switching regimen to low dose ICS-LABA combo as both maintenance and rescue therapy if covered by insurance.

## 2020-08-06 NOTE — Assessment & Plan Note (Signed)
Patient here to establish care. Patient has a BMI of 37.68 in clinic. He reports increased weight gain the last few years, especially in his abdomen. He does not exercise and eats potatoes, bread, [place], veggies and occasionally bacon.   Plan:  --Work up for cushing's disease --Follow up lipid panel and A1c --Advised to exercise 30 min daily and reduce carb intake --Referral diabetic educator for diet education and resouces at next OV

## 2020-08-07 ENCOUNTER — Other Ambulatory Visit (INDEPENDENT_AMBULATORY_CARE_PROVIDER_SITE_OTHER): Payer: 59

## 2020-08-07 ENCOUNTER — Other Ambulatory Visit: Payer: Self-pay

## 2020-08-07 DIAGNOSIS — J4540 Moderate persistent asthma, uncomplicated: Secondary | ICD-10-CM | POA: Diagnosis not present

## 2020-08-07 DIAGNOSIS — R5383 Other fatigue: Secondary | ICD-10-CM | POA: Diagnosis not present

## 2020-08-07 DIAGNOSIS — R635 Abnormal weight gain: Secondary | ICD-10-CM

## 2020-08-10 ENCOUNTER — Telehealth: Payer: Self-pay

## 2020-08-10 LAB — CBC WITH DIFFERENTIAL/PLATELET
Basophils Absolute: 0.1 10*3/uL (ref 0.0–0.2)
Basos: 1 %
EOS (ABSOLUTE): 0 10*3/uL (ref 0.0–0.4)
Eos: 0 %
Hematocrit: 44.4 % (ref 37.5–51.0)
Hemoglobin: 15.3 g/dL (ref 13.0–17.7)
Immature Grans (Abs): 0.1 10*3/uL (ref 0.0–0.1)
Immature Granulocytes: 1 %
Lymphocytes Absolute: 2.5 10*3/uL (ref 0.7–3.1)
Lymphs: 22 %
MCH: 31.8 pg (ref 26.6–33.0)
MCHC: 34.5 g/dL (ref 31.5–35.7)
MCV: 92 fL (ref 79–97)
Monocytes Absolute: 0.6 10*3/uL (ref 0.1–0.9)
Monocytes: 5 %
Neutrophils Absolute: 8.1 10*3/uL — ABNORMAL HIGH (ref 1.4–7.0)
Neutrophils: 71 %
Platelets: 341 10*3/uL (ref 150–450)
RBC: 4.81 x10E6/uL (ref 4.14–5.80)
RDW: 12.4 % (ref 11.6–15.4)
WBC: 11.4 10*3/uL — ABNORMAL HIGH (ref 3.4–10.8)

## 2020-08-10 LAB — CMP14 + ANION GAP
ALT: 20 IU/L (ref 0–44)
AST: 16 IU/L (ref 0–40)
Albumin/Globulin Ratio: 1.8 (ref 1.2–2.2)
Albumin: 4.8 g/dL (ref 4.0–5.0)
Alkaline Phosphatase: 76 IU/L (ref 44–121)
Anion Gap: 17 mmol/L (ref 10.0–18.0)
BUN/Creatinine Ratio: 14 (ref 9–20)
BUN: 16 mg/dL (ref 6–20)
Bilirubin Total: 0.3 mg/dL (ref 0.0–1.2)
CO2: 22 mmol/L (ref 20–29)
Calcium: 9.7 mg/dL (ref 8.7–10.2)
Chloride: 100 mmol/L (ref 96–106)
Creatinine, Ser: 1.16 mg/dL (ref 0.76–1.27)
Globulin, Total: 2.6 g/dL (ref 1.5–4.5)
Glucose: 87 mg/dL (ref 65–99)
Potassium: 5.1 mmol/L (ref 3.5–5.2)
Sodium: 139 mmol/L (ref 134–144)
Total Protein: 7.4 g/dL (ref 6.0–8.5)
eGFR: 82 mL/min/{1.73_m2} (ref >59)

## 2020-08-10 LAB — TSH: TSH: 0.593 u[IU]/mL (ref 0.450–4.500)

## 2020-08-10 LAB — LIPID PANEL
Chol/HDL Ratio: 2.5 ratio (ref 0.0–5.0)
Cholesterol, Total: 171 mg/dL (ref 100–199)
HDL: 68 mg/dL (ref >39)
LDL Chol Calc (NIH): 92 mg/dL (ref 0–99)
Triglycerides: 54 mg/dL (ref 0–149)
VLDL Cholesterol Cal: 11 mg/dL (ref 5–40)

## 2020-08-10 LAB — CORTISOL-AM, BLOOD: Cortisol - AM: 0.5 ug/dL — ABNORMAL LOW (ref 6.2–19.4)

## 2020-08-10 NOTE — Telephone Encounter (Signed)
RTC, patient notified that some of his labwork is still pending. Also asked patient if he knows which inhaler his insurance will pay for, he states he doesn't.  Asked patient to call his insurance company and ask which medication is on his formulary since they denied Advair. Will also send to [facility] to make sure she did not receive PA for this medication. SChaplin, RN,BSN

## 2020-08-10 NOTE — Telephone Encounter (Signed)
Sounds good.. I do not  see any labs left. I'd be happy prescribe which ever inhale is on his formulary.

## 2020-08-10 NOTE — Telephone Encounter (Signed)
Pt is requesting a call back  He stated he is wanting to know his results ( blood ) and that the inhaler he was given his insurance does not cover it

## 2020-08-11 NOTE — Telephone Encounter (Signed)
Discussed patient's labs with him. All initial labs are within the normal limits. Cortisol level was properly suppressed so low-dose dexamethasone test was normal. I informed patient that we will continue working up his fatigue and weight gain at his next office visit on 4/21. His insurance did not cover his Advair so he plans to call them to ask what they will cover. He will call the office to let us know which inhaler they will cover so I can prescribe it for him.

## 2020-08-11 NOTE — Telephone Encounter (Signed)
I think the cortisol level resulted yesterday afternoon and is abnormal. Thanks Dr. Marchia Bond! Bohdan Macho

## 2020-08-12 ENCOUNTER — Telehealth: Payer: Self-pay

## 2020-08-12 NOTE — Telephone Encounter (Signed)
Let me know what they say and Ill put the prescription in.

## 2020-08-12 NOTE — Telephone Encounter (Signed)
fluticasone-salmeterol (ADVAIR HFA) 115-21 MCG/ACT inhaler, is not covered by the insurance. Pt states he did contact his insurance and they're asking the pt to try another med. Pt would like the office to contact the pharmacy to see what information do they need from the doctor.

## 2020-08-12 NOTE — Telephone Encounter (Signed)
I called [facility] pharmacy who stated with coupon card, Fluticasone cost $121.00. And he was unable to tell me which inhalers are covered; stated pt needs to call the insurance co. I will ask our pharmacy team to help.

## 2020-08-13 ENCOUNTER — Other Ambulatory Visit: Payer: Self-pay | Admitting: Internal Medicine

## 2020-08-13 DIAGNOSIS — J4540 Moderate persistent asthma, uncomplicated: Secondary | ICD-10-CM

## 2020-08-13 MED ORDER — FLUTICASONE-SALMETEROL 100-50 MCG/DOSE IN AEPB: 1.0000 | INHALATION_SPRAY | Freq: Two times a day (BID) | RESPIRATORY_TRACT | 11 refills | Status: DC

## 2020-08-13 NOTE — Telephone Encounter (Signed)
Sounds like a good plan to me. Thank would be very helpful.

## 2020-08-13 NOTE — Telephone Encounter (Signed)
Disk seems to be the best option.

## 2020-08-13 NOTE — Telephone Encounter (Signed)
Hey, the prescription is in. Thanks again!

## 2020-08-13 NOTE — Telephone Encounter (Signed)
Hi Dr. Marchia Bond,  Let me know when you send in rx and I will follow-up with pharmacy and then with patient.  Thanks! Yazmen Briones

## 2020-08-14 NOTE — Telephone Encounter (Signed)
Called [facility] and Advair diskus is covered and it is $20 for a 30 day supply. Attempted to contact patient to make him aware. Left voicemail.

## 2020-08-20 ENCOUNTER — Encounter: Payer: Self-pay | Admitting: Student

## 2020-08-20 ENCOUNTER — Ambulatory Visit: Payer: 59 | Admitting: Student

## 2020-08-20 ENCOUNTER — Other Ambulatory Visit: Payer: Self-pay

## 2020-08-20 VITALS — BP 132/87 | HR 92 | Temp 98.5°F | Ht 64.0 in | Wt 221.0 lb

## 2020-08-20 DIAGNOSIS — G4733 Obstructive sleep apnea (adult) (pediatric): Secondary | ICD-10-CM | POA: Diagnosis not present

## 2020-08-20 DIAGNOSIS — R5383 Other fatigue: Secondary | ICD-10-CM

## 2020-08-20 DIAGNOSIS — E669 Obesity, unspecified: Secondary | ICD-10-CM | POA: Diagnosis not present

## 2020-08-20 DIAGNOSIS — Z114 Encounter for screening for human immunodeficiency virus [HIV]: Secondary | ICD-10-CM | POA: Diagnosis not present

## 2020-08-20 DIAGNOSIS — J4540 Moderate persistent asthma, uncomplicated: Secondary | ICD-10-CM

## 2020-08-20 DIAGNOSIS — Z0000 Encounter for general adult medical examination without abnormal findings: Secondary | ICD-10-CM

## 2020-08-20 DIAGNOSIS — G4730 Sleep apnea, unspecified: Secondary | ICD-10-CM

## 2020-08-20 LAB — POCT GLYCOSYLATED HEMOGLOBIN (HGB A1C): Hemoglobin A1C: 5.6 % (ref 4.0–5.6)

## 2020-08-20 LAB — GLUCOSE, CAPILLARY: Glucose-Capillary: 97 mg/dL (ref 70–99)

## 2020-08-20 NOTE — Progress Notes (Signed)
   CC: Follow up  HPI:  [name] is a 40 y.o. M with PMH as below who presents to the clinic for follow-up on his chronic medical problems. Please see problem based charting for evaluation, assessment and plan.  Past Medical History:  Diagnosis Date  . Asthma     Review of Systems:  Constitutional: Positive for fatigue and weight gain Respiratory: Negative for shortness of breath.  Negative for wheezing or cough Cardiac: Negative for chest pain MSK: Negative for back pain Abdomen: Negative for abdominal pain, constipation or diarrhea Neuro: Negative for headache or weakness  Physical Exam: General: Pleasant, obese, well-appearing male. No acute distress. Neck: Supple. No thyromegaly or lymphadenopathy. No JVD. Cardiac: Mild tachycardia. Regular Rhythm. No murmurs, rubs or gallops. No LE edema Respiratory: Lungs CTAB. No wheezing or crackles. Abdominal: Soft, symmetric and non tender. Normal BS. Central obesity with mild tympany. Bedside ultrasound did not show any ascites in the abdomen. Skin: Warm, dry and intact without rashes or lesions. Stretch marks on lower abdomen.  Extremities: Atraumatic. Full ROM. Pulse palpable. Neuro: A&O x 3. Moves all extremities. Normal sensation. Psych: Appropriate mood and affect.  Vitals:   08/20/20 0855  BP: 132/87  Pulse: 92  Temp: 98.5 F (36.9 C)  TempSrc: Oral  SpO2: 97%  Weight: 221 lb (100.2 kg)  Height: 5\' 4"  (1.626 m)    Assessment & Plan:   See Encounters Tab for problem based charting.  Patient discussed with Dr. , MD, MPH

## 2020-08-20 NOTE — Patient Instructions (Signed)
Thank you, [name] for allowing [place] to provide your care today. Today we discussed your weight gain and fatigue.     I have ordered the following labs for you:   Lab Orders     POC Hbg A1C   I will call if any are abnormal. All of your labs can be accessed through "My Chart".  I have place a referrals to Medical Weight Management and a sleep study  Please follow-up in 3 months.  Should you have any questions or concerns please call the internal medicine clinic at [phone].    Sharrell Ku, MD, MPH [facility]   My Chart Access: [email or web address]   If you have not already done so, please get your COVID 19 vaccine  To schedule an appointment for a COVID vaccine choice any of the following: Go to [email or web address]   Go to [email or web address]                  Call [phone]                                     Call [phone] and select Option 2

## 2020-08-21 ENCOUNTER — Encounter: Payer: Self-pay | Admitting: Student

## 2020-08-21 DIAGNOSIS — G4733 Obstructive sleep apnea (adult) (pediatric): Secondary | ICD-10-CM | POA: Insufficient documentation

## 2020-08-21 DIAGNOSIS — Z0000 Encounter for general adult medical examination without abnormal findings: Secondary | ICD-10-CM | POA: Insufficient documentation

## 2020-08-21 LAB — HIV ANTIBODY (ROUTINE TESTING W REFLEX): HIV Screen 4th Generation wRfx: NONREACTIVE

## 2020-08-21 LAB — HEPATITIS C ANTIBODY: Hep C Virus Ab: <0.1 s/co ratio (ref 0.0–0.9)

## 2020-08-21 NOTE — Assessment & Plan Note (Signed)
Patient states he continues to feel fatigued even after getting 7 to 8 hours of sleep. States he is actually sleeping more. Work-up for hypothyroidism and Cushing's disease are both negative. 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 Patient also at risk of OSA which can cause fatigue so plan to evaluate with a sleep study.  Plan: --Sleep study to assess for OSA -- If negative, can consider an evaluation for heart failure with patient's unexplained fatigue, weight gain and family history of heart failure.

## 2020-08-21 NOTE — Progress Notes (Signed)
Internal Medicine Clinic Attending  Case discussed with Dr. Amponsah  At the time of the visit.  We reviewed the resident's history and exam and pertinent patient test results.  I agree with the assessment, diagnosis, and plan of care documented in the resident's note.  

## 2020-08-21 NOTE — Assessment & Plan Note (Signed)
Patient with a complaint of weight gain and fatigue has a STOP-BANG score of 4 which indicates high risk of OSA.  Plan to get a sleep study to formally diagnose.  Plan: --Referral for sleep study

## 2020-08-21 NOTE — Assessment & Plan Note (Signed)
Patient states he was eventually able to get his Advair Diskus.  His shortness of breath is improved but he continues to have low energy.  Plan: --Continue albuterol as needed for shortness of breath or cough --Continue Advair Diskus 1 puff twice daily

## 2020-08-21 NOTE — Assessment & Plan Note (Signed)
Patient has gained 2 pounds since last visit 2 weeks ago.  States he actually eats less food because he is on third shift. He does not do much exercise at home due to fatigue. Patient was counseled on reducing intake of processed foods, sweets and fried foods and increasing intake of vegetables, fish, nuts and baked foods as well as reduce overall caloric intake.  Plan: --Referred to medical weight management clinic -- Encouraged to continue lifestyle modification measures eating healthy meals and exercise at least 30 minutes a day.

## 2020-09-24 ENCOUNTER — Encounter: Payer: Self-pay | Admitting: *Deleted

## 2020-10-15 ENCOUNTER — Institutional Professional Consult (permissible substitution): Payer: 59 | Admitting: Neurology

## 2020-10-15 ENCOUNTER — Encounter: Payer: Self-pay | Admitting: Neurology

## 2020-10-15 ENCOUNTER — Telehealth: Payer: Self-pay

## 2020-10-15 NOTE — Addendum Note (Signed)
Addended by: Neomia Dear on: 10/15/2020 06:47 PM   Modules accepted: Orders

## 2020-10-15 NOTE — Telephone Encounter (Signed)
Patient no showed 10/15/20 sleep consult with Dr. Frances Furbish.

## 2020-10-21 NOTE — Addendum Note (Signed)
Addended by: Neomia Dear on: 10/21/2020 05:10 PM   Modules accepted: Orders

## 2020-11-03 ENCOUNTER — Encounter: Payer: Self-pay | Admitting: *Deleted

## 2020-11-12 ENCOUNTER — Emergency Department (HOSPITAL_COMMUNITY)
Admission: EM | Admit: 2020-11-12 | Discharge: 2020-11-12 | Disposition: A | Discharge status: Home / Self Care | Payer: 59 | Attending: Emergency Medicine | Admitting: Emergency Medicine

## 2020-11-12 ENCOUNTER — Emergency Department (HOSPITAL_COMMUNITY): Payer: 59

## 2020-11-12 DIAGNOSIS — F17200 Nicotine dependence, unspecified, uncomplicated: Secondary | ICD-10-CM | POA: Diagnosis not present

## 2020-11-12 DIAGNOSIS — U071 COVID-19: Secondary | ICD-10-CM | POA: Diagnosis not present

## 2020-11-12 DIAGNOSIS — R0789 Other chest pain: Secondary | ICD-10-CM | POA: Diagnosis not present

## 2020-11-12 DIAGNOSIS — Z7951 Long term (current) use of inhaled steroids: Secondary | ICD-10-CM | POA: Insufficient documentation

## 2020-11-12 DIAGNOSIS — J45909 Unspecified asthma, uncomplicated: Secondary | ICD-10-CM | POA: Diagnosis not present

## 2020-11-12 DIAGNOSIS — R0602 Shortness of breath: Secondary | ICD-10-CM | POA: Diagnosis not present

## 2020-11-12 DIAGNOSIS — R000 Tachycardia, unspecified: Secondary | ICD-10-CM | POA: Insufficient documentation

## 2020-11-12 DIAGNOSIS — J9811 Atelectasis: Secondary | ICD-10-CM | POA: Diagnosis not present

## 2020-11-12 LAB — BASIC METABOLIC PANEL
Anion gap: 7 (ref 5–15)
BUN: 16 mg/dL (ref 6–20)
CO2: 26 mmol/L (ref 22–32)
Calcium: 9.3 mg/dL (ref 8.9–10.3)
Chloride: 104 mmol/L (ref 98–111)
Creatinine, Ser: 1.19 mg/dL (ref 0.61–1.24)
GFR, Estimated: >60 mL/min (ref >60)
Glucose, Bld: 92 mg/dL (ref 70–99)
Potassium: 4.1 mmol/L (ref 3.5–5.1)
Sodium: 137 mmol/L (ref 135–145)

## 2020-11-12 LAB — CBC WITH DIFFERENTIAL/PLATELET
Abs Immature Granulocytes: 0.08 10*3/uL — ABNORMAL HIGH (ref 0.00–0.07)
Basophils Absolute: 0.1 10*3/uL (ref 0.0–0.1)
Basophils Relative: 1 %
Eosinophils Absolute: 0.2 10*3/uL (ref 0.0–0.5)
Eosinophils Relative: 2 %
HCT: 43.8 % (ref 39.0–52.0)
Hemoglobin: 14.6 g/dL (ref 13.0–17.0)
Immature Granulocytes: 1 %
Lymphocytes Relative: 27 %
Lymphs Abs: 3.3 10*3/uL (ref 0.7–4.0)
MCH: 32 pg (ref 26.0–34.0)
MCHC: 33.3 g/dL (ref 30.0–36.0)
MCV: 96.1 fL (ref 80.0–100.0)
Monocytes Absolute: 1.2 10*3/uL — ABNORMAL HIGH (ref 0.1–1.0)
Monocytes Relative: 9 %
Neutro Abs: 7.5 10*3/uL (ref 1.7–7.7)
Neutrophils Relative %: 60 %
Platelets: 324 10*3/uL (ref 150–400)
RBC: 4.56 MIL/uL (ref 4.22–5.81)
RDW: 12.8 % (ref 11.5–15.5)
WBC: 12.3 10*3/uL — ABNORMAL HIGH (ref 4.0–10.5)
nRBC: 0 % (ref 0.0–0.2)

## 2020-11-12 LAB — TROPONIN I (HIGH SENSITIVITY)
Troponin I (High Sensitivity): 6 ng/L (ref <18)
Troponin I (High Sensitivity): 7 ng/L (ref <18)

## 2020-11-12 MED ORDER — ALBUTEROL SULFATE HFA 108 (90 BASE) MCG/ACT IN AERS: 1.0000 | INHALATION_SPRAY | Freq: Four times a day (QID) | RESPIRATORY_TRACT | 0 refills | Status: DC | PRN

## 2020-11-12 MED ORDER — PAXLOVID 10 X 150 MG & 10 X 100MG PO TBPK: 2.0000 | ORAL_TABLET | Freq: Two times a day (BID) | ORAL | 0 refills | Status: AC

## 2020-11-12 NOTE — ED Triage Notes (Signed)
Pt states he is covid+, wants to know if he has pneumonia as well. Hx asthma. Endorses slight SHOB w exertion, tightness in chest w same

## 2020-11-12 NOTE — Discharge Instructions (Signed)
Hold your Advair discus medication while taking the paxlovid.  You can take the albuterol as needed for wheezing.

## 2020-11-12 NOTE — ED Provider Notes (Signed)
Emergency Medicine Provider Triage Evaluation Note  Trevor Novak , a 40 y.o. male  was evaluated in triage.  Pt complains of shortness of breath and central, non-radiated chest tightness. Patient tested positive for COVID 2 days ago. He is currently vaccinated against COVID; however, has not received his booster shot. No fever or chills. Denies nausea, vomiting, and diarrhea. He has a history of asthma. Denies wheezing. SOB and chest tightness worse with exertion.   Review of Systems  Positive: Chest pain, SOB Negative: Fever  Physical Exam  BP 135/86   Pulse 96   Temp 98.5 F (36.9 C) (Oral)   Resp 16   SpO2 97%  Gen:   Awake, no distress   Resp:  Normal effort, no wheeze or stridor MSK:   Moves extremities without difficulty  Other:    Medical Decision Making  Medically screening exam initiated at 6:45 PM.  Appropriate orders placed.  Tamer Ivins was informed that the remainder of the evaluation will be completed by another provider, this initial triage assessment does not replace that evaluation, and the importance of remaining in the ED until their evaluation is complete.  Cardiac labs ordered. CXR   Jesusita Oka 11/12/20 1847    Linwood Dibbles, MD 11/15/20 [phone]

## 2020-11-12 NOTE — ED Provider Notes (Signed)
[facility] EMERGENCY DEPARTMENT Provider Note   CSN: [id number] Arrival date & time: 11/12/20  1815     History Chief Complaint  Patient presents with   Covid Positive    Trevor Novak is a 40 y.o. male.  HPI  Patient presents to the ED for evaluation of shortness of breath and chest tightness.  Patient states he tested positive for COVID 2 days ago.  He has been vaccinated for COVID.  Patient is concerned that he may be developing pneumonia.  He is not having any fevers or chills.  No nausea vomiting or diarrhea.  No wheezing.  He does have a history of asthma.  Past Medical History:  Diagnosis Date   Asthma     Patient Active Problem List   Diagnosis Date Noted   Healthcare maintenance 08/21/2020   Suspected OSA 08/21/2020   Asthma 08/06/2020   Fatigue 08/06/2020   Obesity (BMI 35.0-39.9 without comorbidity) 08/06/2020    No past surgical history on file.     Family History  Problem Relation Age of Onset   Heart failure Mother    Heart failure Father    Hypertension Father    Diabetes Father     Social History   Tobacco Use   Smoking status: Every Day   Smokeless tobacco: Never   Tobacco comments:    0.5 PPD  Vaping Use   Vaping Use: Never used  Substance Use Topics   Alcohol use: Yes    Comment: occasionally    Drug use: No    Home Medications Prior to Admission medications   Medication Sig Start Date End Date Taking? Authorizing Provider  albuterol (VENTOLIN HFA) 108 (90 Base) MCG/ACT inhaler Inhale 1-2 puffs into the lungs every 6 (six) hours as needed for wheezing or shortness of breath. 11/12/20  Yes Linwood Dibbles, MD  nirmatrelvir/ritonavir EUA, renal dosing, (PAXLOVID) TBPK Take 2 tablets by mouth 2 (two) times daily for 5 days. Patient GFR is >60. Take nirmatrelvir (150 mg) one tablet twice daily for 5 days and ritonavir (100 mg) one tablet twice daily for 5 days. 11/12/20 11/17/20 Yes Linwood Dibbles, MD  albuterol (VENTOLIN HFA) 108  (90 Base) MCG/ACT inhaler Inhale 2 puffs into the lungs every 4 (four) hours as needed for wheezing or shortness of breath. 02/22/19   Jeannie Fend, PA-C  diphenhydrAMINE (BENADRYL) 25 mg capsule Take 25 mg by mouth every 6 (six) hours as needed for itching or allergies.    [provider]  Fluticasone-Salmeterol (ADVAIR DISKUS) 100-50 MCG/DOSE AEPB Inhale 1 puff into the lungs in the morning and at bedtime. 08/13/20 08/13/21  Dellia Cloud, MD    Allergies    Patient has no known allergies.  Review of Systems   Review of Systems  Constitutional:  Negative for fatigue and fever.  All other systems reviewed and are negative.  Physical Exam Updated Vital Signs BP 118/85   Pulse 96   Temp 98.5 F (36.9 C) (Oral)   Resp 16   SpO2 97%   Physical Exam Vitals and nursing note reviewed.  Constitutional:      General: He is not in acute distress.    Appearance: He is well-developed.  HENT:     Head: Normocephalic and atraumatic.     Right Ear: External ear normal.     Left Ear: External ear normal.  Eyes:     General: No scleral icterus.       Right eye: No discharge.  Left eye: No discharge.     Conjunctiva/sclera: Conjunctivae normal.  Neck:     Trachea: No tracheal deviation.  Cardiovascular:     Rate and Rhythm: Normal rate and regular rhythm.  Pulmonary:     Effort: Pulmonary effort is normal. No respiratory distress.     Breath sounds: Normal breath sounds. No stridor. No wheezing or rales.  Abdominal:     General: Bowel sounds are normal. There is no distension.     Palpations: Abdomen is soft.     Tenderness: There is no abdominal tenderness. There is no guarding or rebound.  Musculoskeletal:        General: No tenderness or deformity.     Cervical back: Neck supple.  Skin:    General: Skin is warm and dry.     Findings: No rash.  Neurological:     General: No focal deficit present.     Mental Status: He is alert.     Cranial Nerves: No cranial  nerve deficit (no facial droop, extraocular movements intact, no slurred speech).     Sensory: No sensory deficit.     Motor: No abnormal muscle tone or seizure activity.     Coordination: Coordination normal.  Psychiatric:        Mood and Affect: Mood normal.    ED Results / Procedures / Treatments   Labs (all labs ordered are listed, but only abnormal results are displayed) Labs Reviewed  CBC WITH DIFFERENTIAL/PLATELET - Abnormal; Notable for the following components:      Result Value   WBC 12.3 (*)    Monocytes Absolute 1.2 (*)    Abs Immature Granulocytes 0.08 (*)    All other components within normal limits  BASIC METABOLIC PANEL  TROPONIN I (HIGH SENSITIVITY)  TROPONIN I (HIGH SENSITIVITY)    EKG EKG Interpretation  Date/Time:  Thursday November 12 2020 18:32:01 EDT Ventricular Rate:  101 PR Interval:  138 QRS Duration: 70 QT Interval:  324 QTC Calculation: 420 R Axis:   68 Text Interpretation: Sinus tachycardia Inferolateral T wave abnormality, new since last EKG Abnormal ECG Confirmed by Linwood Dibbles [phone]) on 11/12/2020 9:37:05 PM  Radiology DG Chest 2 View  Result Date: 11/12/2020 CLINICAL DATA:  Shortness of breath, COVID positive. EXAM: CHEST - 2 VIEW COMPARISON:  None. FINDINGS: The heart size and mediastinal contours are within normal limits. Reduced lung volumes with bibasilar atelectasis. No pleural effusion. No pneumothorax. The visualized skeletal structures are unremarkable. IMPRESSION: Reduced lung volumes with bibasilar atelectasis. Electronically Signed   By: Maudry Mayhew MD   On: 11/12/2020 19:48    Procedures Procedures   Medications Ordered in ED Medications - No data to display  ED Course  I have reviewed the triage vital signs and the nursing notes.  Pertinent labs & imaging results that were available during my care of the patient were reviewed by me and considered in my medical decision making (see chart for details).    MDM  Rules/Calculators/A&P                          Patient tested positive for COVID 2 days ago.  His ED work-up is reassuring.  He is not hypoxic.  He is not febrile.  Patient is not wheezing in the ED.  He Hastert for refill of his albuterol though I explained to him that would not help him for covid unless he starts having some wheezing.  Patient does  have asthma.  This qualifies him for paxlovid.  Discussed treatment with the patient and he is interested Final Clinical Impression(s) / ED Diagnoses Final diagnoses:  COVID    Rx / DC Orders ED Discharge Orders          Ordered    albuterol (VENTOLIN HFA) 108 (90 Base) MCG/ACT inhaler  Every 6 hours PRN        11/12/20 2212    nirmatrelvir/ritonavir EUA, renal dosing, (PAXLOVID) TBPK  2 times daily        11/12/20 2212             Linwood Dibbles, MD 11/12/20 2214

## 2021-08-07 ENCOUNTER — Encounter (HOSPITAL_COMMUNITY): Payer: Self-pay | Admitting: Emergency Medicine

## 2021-08-07 ENCOUNTER — Other Ambulatory Visit: Payer: Self-pay

## 2021-08-07 ENCOUNTER — Emergency Department (HOSPITAL_COMMUNITY): Payer: 59

## 2021-08-07 ENCOUNTER — Emergency Department (HOSPITAL_COMMUNITY)
Admission: EM | Admit: 2021-08-07 | Discharge: 2021-08-07 | Disposition: A | Discharge status: Home / Self Care | Payer: 59 | Attending: Emergency Medicine | Admitting: Emergency Medicine

## 2021-08-07 DIAGNOSIS — J209 Acute bronchitis, unspecified: Secondary | ICD-10-CM | POA: Insufficient documentation

## 2021-08-07 DIAGNOSIS — F17210 Nicotine dependence, cigarettes, uncomplicated: Secondary | ICD-10-CM | POA: Insufficient documentation

## 2021-08-07 DIAGNOSIS — R059 Cough, unspecified: Secondary | ICD-10-CM | POA: Diagnosis not present

## 2021-08-07 DIAGNOSIS — J45909 Unspecified asthma, uncomplicated: Secondary | ICD-10-CM | POA: Diagnosis not present

## 2021-08-07 MED ORDER — BENZONATATE 100 MG PO CAPS: 100.0000 mg | ORAL_CAPSULE | Freq: Three times a day (TID) | ORAL | 0 refills | Status: DC

## 2021-08-07 NOTE — ED Provider Notes (Signed)
?[facility] ?Provider Note ? ? ?CSN: [id number] ?Arrival date & time: 08/07/21  1358 ? ?  ? ?History ? ?Chief Complaint  ?Patient presents with  ? Cough  ? ? ?Trevor Novak is a 41 y.o. male with medical history of asthma.  The patient presents to the ED for evaluation of cough that began this past Tuesday.  Patient denies any known sick contacts.  Patient denies being vaccinated for COVID-19 or influenza.  Patient states that he is a 1 pack/day smoker and has done so since "I can remember".  Patient states that his cough is productive however he denies any fevers, body aches or chills, sore throat, headache, nausea, vomiting, abdominal pain or diarrhea.  The patient denies any chest pain or shortness of breath or wheezing. ? ? ?Cough ?Associated symptoms: no chest pain, no chills, no fever, no headaches, no shortness of breath, no sore throat and no wheezing   ? ?  ? ?Home Medications ?Prior to Admission medications   ?Medication Sig Start Date End Date Taking? Authorizing Provider  ?benzonatate (TESSALON) 100 MG capsule Take 1 capsule (100 mg total) by mouth every 8 (eight) hours. 08/07/21  Yes Azucena Cecil, PA-C  ?albuterol (VENTOLIN HFA) 108 (90 Base) MCG/ACT inhaler Inhale 2 puffs into the lungs every 4 (four) hours as needed for wheezing or shortness of breath. 02/22/19   Tacy Learn, PA-C  ?albuterol (VENTOLIN HFA) 108 (90 Base) MCG/ACT inhaler Inhale 1-2 puffs into the lungs every 6 (six) hours as needed for wheezing or shortness of breath. 11/12/20   Dorie Rank, MD  ?diphenhydrAMINE (BENADRYL) 25 mg capsule Take 25 mg by mouth every 6 (six) hours as needed for itching or allergies.    [provider]  ?Fluticasone-Salmeterol (ADVAIR DISKUS) 100-50 MCG/DOSE AEPB Inhale 1 puff into the lungs in the morning and at bedtime. 08/13/20 08/13/21  Marianna Payment, MD  ?   ? ?Allergies    ?Patient has no known allergies.   ? ?Review of Systems   ?Review of Systems   ?Constitutional:  Negative for chills and fever.  ?HENT:  Negative for sore throat.   ?Respiratory:  Positive for cough. Negative for shortness of breath and wheezing.   ?Cardiovascular:  Negative for chest pain.  ?Gastrointestinal:  Negative for abdominal pain, diarrhea, nausea and vomiting.  ?Neurological:  Negative for headaches.  ?All other systems reviewed and are negative. ? ?Physical Exam ?Updated Vital Signs ?BP (!) 139/97 (BP Location: Right Arm)   Pulse 91   Temp 98.6 ?F (37 ?C) (Oral)   Resp 18   SpO2 98%  ?Physical Exam ?Vitals and nursing note reviewed.  ?Constitutional:   ?   General: He is not in acute distress. ?   Appearance: Normal appearance. He is not ill-appearing, toxic-appearing or diaphoretic.  ?HENT:  ?   Head: Normocephalic and atraumatic.  ?   Nose: Nose normal. No congestion.  ?   Mouth/Throat:  ?   Mouth: Mucous membranes are moist.  ?   Pharynx: Oropharynx is clear. No oropharyngeal exudate or posterior oropharyngeal erythema.  ?Eyes:  ?   Extraocular Movements: Extraocular movements intact.  ?   Conjunctiva/sclera: Conjunctivae normal.  ?   Pupils: Pupils are equal, round, and reactive to light.  ?Cardiovascular:  ?   Rate and Rhythm: Normal rate and regular rhythm.  ?Pulmonary:  ?   Effort: Pulmonary effort is normal. No respiratory distress.  ?   Breath sounds: Normal breath  sounds. No stridor. No wheezing, rhonchi or rales.  ?Chest:  ?   Chest wall: No tenderness.  ?Abdominal:  ?   General: Abdomen is flat. Bowel sounds are normal.  ?   Palpations: Abdomen is soft.  ?   Tenderness: There is no abdominal tenderness.  ?Musculoskeletal:  ?   Cervical back: Normal range of motion and neck supple. No tenderness.  ?Skin: ?   General: Skin is warm and dry.  ?   Capillary Refill: Capillary refill takes less than 2 seconds.  ?Neurological:  ?   General: No focal deficit present.  ?   Mental Status: He is alert and oriented to person, place, and time.  ? ? ?ED Results / Procedures /  Treatments   ?Labs ?(all labs ordered are listed, but only abnormal results are displayed) ?Labs Reviewed - No data to display ? ?EKG ?None ? ?Radiology ?DG Chest 2 View ? ?Result Date: 08/07/2021 ?CLINICAL DATA:  Cough EXAM: CHEST - 2 VIEW COMPARISON:  Chest x-ray dated 11/12/2020 FINDINGS: Heart size and mediastinal contours are within normal limits. Lungs are clear. Lung volumes are normal. No pleural effusion or pneumothorax is seen. Osseous structures about the chest are unremarkable. IMPRESSION: Normal chest x-ray. Electronically Signed   By: Franki Cabot M.D.   On: 08/07/2021 14:36   ? ?Procedures ?Procedures  ? ? ?Medications Ordered in ED ?Medications - No data to display ? ?ED Course/ Medical Decision Making/ A&P ?  ?                        ?Medical Decision Making ?Amount and/or Complexity of Data Reviewed ?Radiology: ordered. ? ? ?41 year old male presents to ED for evaluation of cough.  Please see HPI for further details. ? ?On examination, the patient is afebrile, nontachycardic, nonhypoxic.  The patient's lung sounds are clear bilaterally.  The patient's abdomen is soft compressible all 4 quadrants.  Patient posterior oropharynx shows no signs of erythema, uvular swelling, exudate.  Patient nontoxic in appearance. ? ?Patient chest x-ray shows no signs of effusion, consolidation, mediastinal widening, cardiomegaly.  Doubt pneumonia at this time. ? ?Patient denies any systemic symptoms such as fever, nausea, vomiting, diarrhea.  Due to this, I have low suspicion at this time for bacterial infection.  Patient states he is a heavy smoker.  I believe this is contributing to his cough.  I believe most likely cause of patient cough due to bronchitis.  I have advised the patient to stop smoking and I have also advised him to purchase Flonase over-the-counter as well as Zyrtec for seasonal allergies which he states he suffers from.  I have provided the patient with Ladona Ridgel and also advised him to  follow-up with his PCP if by day 10 his cough has not subsided. ? ?Patient given return precautions and he voiced understanding.  The patient had all of his questions answered to satisfaction.  The patient is stable at this time for discharge. ? ?Final Clinical Impression(s) / ED Diagnoses ?Final diagnoses:  ?Acute bronchitis, unspecified organism  ? ? ?Rx / DC Orders ?ED Discharge Orders   ? ?      Ordered  ?  benzonatate (TESSALON) 100 MG capsule  Every 8 hours       ? 08/07/21 1536  ? ?  ?  ? ?  ? ? ?  ?Azucena Cecil, PA-C ?08/07/21 1541 ? ?  ?Lennice Sites, DO ?08/07/21 2338 ? ?

## 2021-08-07 NOTE — ED Triage Notes (Signed)
Patient here with complaint of productive cough that started on Tuesday. Patient states he felt like it was related to allergies and tried taking benadryl with no relief. Afebrile, patient is alert, oriented, ambulatory, and in no apparent distress at this time. ?

## 2021-08-07 NOTE — Discharge Instructions (Addendum)
Return to the ED with any new symptoms such as shortness of breath, wheezing, fevers ?Please follow-up with your PCP for any ongoing needs ?Please make attempts to stop smoking if you are able to do so.  I believe this is contributing to your cough. ?Please read the attached informational guide concerning acute bronchitis ?Please pick up your prescription medication I have sent in for you ?Please also pick up Flonase as well as Zyrtec for seasonal allergies ?If your cough persist past 10 days, please report to urgent care or ED for antibiotics ?

## 2021-09-30 ENCOUNTER — Other Ambulatory Visit (HOSPITAL_COMMUNITY): Payer: Self-pay

## 2021-09-30 ENCOUNTER — Encounter: Payer: Self-pay | Admitting: Student

## 2021-09-30 ENCOUNTER — Ambulatory Visit (INDEPENDENT_AMBULATORY_CARE_PROVIDER_SITE_OTHER): Payer: 59 | Admitting: Student

## 2021-09-30 VITALS — BP 140/90 | HR 85 | Temp 98.2°F | Ht 64.0 in | Wt 229.0 lb

## 2021-09-30 DIAGNOSIS — G4730 Sleep apnea, unspecified: Secondary | ICD-10-CM | POA: Diagnosis not present

## 2021-09-30 DIAGNOSIS — Z716 Tobacco abuse counseling: Secondary | ICD-10-CM | POA: Diagnosis not present

## 2021-09-30 DIAGNOSIS — G4733 Obstructive sleep apnea (adult) (pediatric): Secondary | ICD-10-CM | POA: Diagnosis not present

## 2021-09-30 DIAGNOSIS — D72829 Elevated white blood cell count, unspecified: Secondary | ICD-10-CM | POA: Diagnosis not present

## 2021-09-30 DIAGNOSIS — R5383 Other fatigue: Secondary | ICD-10-CM

## 2021-09-30 DIAGNOSIS — J4540 Moderate persistent asthma, uncomplicated: Secondary | ICD-10-CM | POA: Diagnosis not present

## 2021-09-30 DIAGNOSIS — E669 Obesity, unspecified: Secondary | ICD-10-CM | POA: Diagnosis not present

## 2021-09-30 MED ORDER — BUPROPION HCL ER (SR) 150 MG PO TB12
ORAL_TABLET | ORAL | 0 refills | Status: DC
  Filled 2021-09-30: qty 60, 30d supply, fill #0

## 2021-09-30 MED ORDER — ALBUTEROL SULFATE HFA 108 (90 BASE) MCG/ACT IN AERS
2.0000 | INHALATION_SPRAY | RESPIRATORY_TRACT | 12 refills | Status: DC | PRN
  Filled 2021-09-30: qty 18, 25d supply, fill #0

## 2021-09-30 NOTE — Progress Notes (Signed)
   CC: Follow-up  HPI:  [name] is a 41 y.o. male with PMH as below who presents to clinic to follow-up on his chronic medical problems. Please see problem based charting for evaluation, assessment and plan.  Past Medical History:  Diagnosis Date   Asthma    Obesity (BMI 35.0-39.9 without comorbidity)     Review of Systems:  Constitutional: Positive for fatigue and weight gain. Respiratory: Positive for occasional dyspnea on exertion Cardiac: Negative for chest pain Neuro: Negative for headache, dizziness or weakness  Physical Exam: General: Pleasant, obese middle-age man. No acute distress. Cardiac: RRR. No murmurs, rubs or gallops. No LE edema Respiratory: Lungs CTAB. No wheezing or crackles. Abdominal: Abdominal obesity.  Soft, symmetric and non tender. Normal BS. Skin: Warm, dry and intact without rashes or lesions Extremities: Atraumatic. Full ROM. Palpable radial and DP pulses. Neuro: A&O x 3. Moves all extremities. Normal sensation to gross touch. Psych: Appropriate mood and affect.  Vitals:   09/30/21 1024  BP: 140/90  Pulse: 85  Temp: 98.2 F (36.8 C)  TempSrc: Oral  SpO2: 100%  Weight: 229 lb (103.9 kg)  Height: 5\' 4"  (1.626 m)    Assessment & Plan:   See Encounters Tab for problem based charting.  Patient discussed with Dr. , MD, MP

## 2021-09-30 NOTE — Patient Instructions (Addendum)
Thank you, Mr.Edman Raffo for allowing [place] to provide your care today. Today we discussed your fatigue, weight gain, smoking and asthma.  I am glad you are ready to quit smoking.  I am starting you on some medication to help you quit.  Lets set your quit date to July 1st, 2023.  I have ordered the following labs for you:  Lab Orders         CBC no Diff       I will call if any are abnormal. All of your labs can be accessed through "My Chart".  I have place a referrals to diabetic coordinator for weight management and nutrition counseling  I have ordered the following tests: Sleep study  I have ordered the following medication/changed the following medications:  Start bupropion 150 mg daily for 3 days then 300 mg daily until quit date  My Chart Access: [email or web address]  Please follow-up in 6 months  Please make sure to arrive 15 minutes prior to your next appointment. If you arrive late, you may be asked to reschedule.    We look forward to seeing you next time. Please call our clinic at [phone] if you have any questions or concerns. The best time to call is Monday-Friday from 9am-4pm, but there is someone available 24/7. If after hours or the weekend, call the main hospital number and ask for the Internal Medicine Resident On-Call. If you need medication refills, please notify your pharmacy one week in advance and they will send [place] a request.   Thank you for letting us take part in your care. Wishing you the best!  Steffanie Rainwater, MD 09/30/2021, 11:08 AM IM Resident, PGY-2 [facility] Heck 41:10

## 2021-10-01 ENCOUNTER — Encounter: Payer: Self-pay | Admitting: Student

## 2021-10-01 ENCOUNTER — Other Ambulatory Visit (HOSPITAL_COMMUNITY): Payer: Self-pay

## 2021-10-01 DIAGNOSIS — Z716 Tobacco abuse counseling: Secondary | ICD-10-CM | POA: Insufficient documentation

## 2021-10-01 LAB — CBC
Hematocrit: 40.7 % (ref 37.5–51.0)
Hemoglobin: 13.8 g/dL (ref 13.0–17.7)
MCH: 31.3 pg (ref 26.6–33.0)
MCHC: 33.9 g/dL (ref 31.5–35.7)
MCV: 92 fL (ref 79–97)
Platelets: 328 10*3/uL (ref 150–450)
RBC: 4.41 x10E6/uL (ref 4.14–5.80)
RDW: 12.7 % (ref 11.6–15.4)
WBC: 12.6 10*3/uL — ABNORMAL HIGH (ref 3.4–10.8)

## 2021-10-01 MED ORDER — FLUTICASONE-SALMETEROL 100-50 MCG/ACT IN AEPB
1.0000 | INHALATION_SPRAY | Freq: Two times a day (BID) | RESPIRATORY_TRACT | 5 refills | Status: DC
  Filled 2021-10-01: qty 60, 30d supply, fill #0

## 2021-10-01 NOTE — Assessment & Plan Note (Signed)
Patient's weight is up 8 pounds since last year.  Patient reports not eating a healthy diet due to working third shift.  Patient was referred to medical weight management last year but reports she did not receive any phone call from them.  Patient agreeable to referral to nutritionist to assist with dietary changes and weight loss. -Referral to Lupita Leash for nutrition counseling and weight loss -Encouraged to get moderate exercise 2-3 times a week.

## 2021-10-01 NOTE — Assessment & Plan Note (Signed)
Patient reports he did not receive any phone call for the sleep study last year. He works third shift continues to feel fatigued throughout the day even after sleeping for for 7 to 8 hours. Would like another referral to get the sleep study done. -Referral for sleep study

## 2021-10-01 NOTE — Progress Notes (Signed)
Internal Medicine Clinic Attending  Case discussed with Dr. Amponsah  At the time of the visit.  We reviewed the resident's history and exam and pertinent patient test results.  I agree with the assessment, diagnosis, and plan of care documented in the resident's note.  

## 2021-10-01 NOTE — Assessment & Plan Note (Addendum)
Patient reports his asthma is usually worse around this time of year.  He has been using his inhaler a few times a week for shortness of breath and cough.  Patient admits to still smoking about a pack of cigarettes a day.  States he is willing to quit but needs some help.  Patient also reports he needs refill of his albuterol.  Plan: -Refill albuterol as needed for shortness of breath or cough -Refill Advair discus, 1 puff twice daily -Counseled on smoking cessation and provided resources to help quit

## 2021-10-01 NOTE — Assessment & Plan Note (Signed)
Patient continues to be fatigue during the day. 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  His fatigue can likely be explained by suspected OSA. Plan to evaluate with sleep study. Heart failure remains on the differential with his recent complaint of PND.Other consideration for his fatigue include chronic fatigue syndrome, however this is less likely without other symptoms.  Hemoglobin normal on CBC today, making anemia unlikely cause of his fatigue. Work-up for hypothyroidism and Cushing's disease have been unrevealing.  Plan: -Sleep study referral -Follow-up in 6 months -Work on smoking cessation

## 2021-10-01 NOTE — Assessment & Plan Note (Signed)
Patient with a history of 20-pack year history of tobacco use. Currently smokes about a pack a day. He endorses feeling fatigue, occasional dry cough and some dyspnea on exertion.  Patient states he is ready to quit smoking and would like some resources to assist with his tobacco cessation.  Agreeable quit date of October 30, 2021  Plan: -Start Wellbutrin 150 mg daily for 3 days, then 300 mg daily until complete cessation -Follow-up as needed

## 2021-10-21 ENCOUNTER — Other Ambulatory Visit: Payer: Self-pay | Admitting: Student

## 2021-10-21 ENCOUNTER — Ambulatory Visit (INDEPENDENT_AMBULATORY_CARE_PROVIDER_SITE_OTHER): Payer: 59 | Admitting: Dietician

## 2021-10-21 ENCOUNTER — Encounter: Payer: Self-pay | Admitting: Dietician

## 2021-10-21 ENCOUNTER — Other Ambulatory Visit (HOSPITAL_COMMUNITY): Payer: Self-pay

## 2021-10-21 DIAGNOSIS — Z713 Dietary counseling and surveillance: Secondary | ICD-10-CM | POA: Diagnosis not present

## 2021-10-21 MED ORDER — ALBUTEROL SULFATE HFA 108 (90 BASE) MCG/ACT IN AERS
2.0000 | INHALATION_SPRAY | RESPIRATORY_TRACT | 12 refills | Status: DC | PRN
  Filled 2021-10-21: qty 18, 25d supply, fill #0
  Filled 2021-12-17 – 2022-01-12 (×2): qty 18, 25d supply, fill #1
  Filled 2022-09-07: qty 6.7, 25d supply, fill #1
  Filled 2022-09-23: qty 6.7, 25d supply, fill #2
  Filled 2022-09-23: qty 6.7, 17d supply, fill #2
  Filled 2022-10-07: qty 6.7, 17d supply, fill #3

## 2021-10-21 MED ORDER — FLUTICASONE-SALMETEROL 100-50 MCG/ACT IN AEPB
1.0000 | INHALATION_SPRAY | Freq: Two times a day (BID) | RESPIRATORY_TRACT | 5 refills | Status: DC
  Filled 2021-10-21: qty 60, 30d supply, fill #0
  Filled 2021-12-17: qty 60, 30d supply, fill #1
  Filled 2022-01-31 – 2022-02-25 (×2): qty 60, 30d supply, fill #2

## 2021-10-21 NOTE — Patient Instructions (Addendum)
Call for your sleep study- good sleep is VERY important to weight control  SLEEP MAIN PHONE: [phone] SLEEP REFERRAL PHONE: [phone]   Small changes to your food intake can help you to decrease your weight- ideas we came up with today  Water down your juice Eat fruit instead of honey buns and doughnuts more often   Wt Readings from Last 10 Encounters:  10/21/21 227 lb (103 kg)  09/30/21 229 lb (103.9 kg)  08/20/20 221 lb (100.2 kg)  08/05/20 219 lb 8 oz (99.6 kg)  09/21/19 200 lb (90.7 kg)  02/22/19 197 lb (89.4 kg)  10/03/18 200 lb (90.7 kg)  10/13/17 200 lb (90.7 kg)  08/23/15 185 lb (83.9 kg)   Djon Tith [phone]

## 2021-10-21 NOTE — Progress Notes (Signed)
Sent refills of patient's inhalers to [facility] outpatient pharmacy.

## 2021-10-21 NOTE — Progress Notes (Signed)
  [facility] Nutritional Counseling:  Appt start time: 1030 end time:  1115. Total time: 45 minutes Visit # 1   Assessment:  Primary concerns today: weight loss.  Mr. Birchard would like to weigh ~ 175#.  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 He is working on getting a sleep study. He states he is ready to quit smoking but not ready to start wellbutrin for quitting smoking help because he does not want to set himself up for failure or set a specific quite date. I told him the quit date that is in the doctor's notes( July 1,2023) . He mentions that his schedule is in flux now hat his son just got out of school. He dos want to set small goals to work on to help hi with changing his food intake and weight.   Preferred Learning Style:  No preference indicated  Learning Readiness: Ready and Change in progress by coming to appointment and being assertive in calling sleep center  ANTHROPOMETRICS: Estimated body mass index is 38.96 kg/m as calculated from the following:   Height as of 09/30/21: 5\' 4"  (1.626 m).   Weight as of this encounter: 227 lb (103 kg).  WEIGHT HISTORY: Highest: 42 years old -60#, 41 years old- ~200# Wt Readings from Last 10 Encounters:  10/21/21 227 lb (103 kg)  09/30/21 229 lb (103.9 kg)  08/20/20 221 lb (100.2 kg)  08/05/20 219 lb 8 oz (99.6 kg)  09/21/19 200 lb (90.7 kg)  02/22/19 197 lb (89.4 kg)  10/03/18 200 lb (90.7 kg)  10/13/17 200 lb (90.7 kg)  08/23/15 185 lb (83.9 kg)    SLEEP:broken up due to shift  MEDICATIONS: has not started on wellbutrin yet. Not ready BLOOD SUGAR: DIETARY INTAKE: Usual eating pattern includes 3 meals and 0-2 snacks per day. Everyday foods- see 24 hours food record  Avoided foods include peanut butter.  Food Intolerances: lactose- gassy when he eats cheese, denies constipation Dining Out (times/week): ~7 24-hr recall:  Works 11-7 am, picks up extra shifts  1-3 days a week, sleeps 930-130 and then if no extra shift them 530-9, does not feel rested when he awakens M1( 2-3 AM): [employer] bread- chix noodle soup with 2-3-6 cracjers or spicy chicken sandwich,  20 oz reg soda M2 ( 9-10 AM): 3-4 boiled eggs, 4 strips bacon M3 ( 3-4 PM): He cooks baked chicken- 2 thighs and a leg, ~ 2/3 c white rice, 1/2 o 1 c string beans, sometime goes back for second helpings,  Snk ( PM): doughnuts, honey buns, chips  Beverages: 36 oz juice per day, 20 oz soda, 12-16 oz water, 12 oz coffee or red bull and 3-4 shots vodka 1x/month  Usual physical activity: work and plays with 40 yo boy  Progress Towards Goal(s):  In progress.  Intervention:  Nutrition education about weight loss and healthier food options Action Goal:see patient instructions  Outcome goal: increased knowledge about weight loss and healthier lifestyle options, goal setting Coordination of care:   Teaching Method Utilized: Visual, Auditory,Hands on Handouts given during visit include: After visit summary Barriers to learning/adherence to lifestyle change: competing values with quitting smoking and sleep issues that he is trying to resolve Demonstrated degree of understanding via:  Teach Back   Monitoring/Evaluation:  Dietary intake, exercise, and body weight in 3 week(s) 10, RD 10/21/2021 2:19 PM. .

## 2021-10-22 ENCOUNTER — Other Ambulatory Visit (HOSPITAL_COMMUNITY): Payer: Self-pay

## 2021-10-25 ENCOUNTER — Other Ambulatory Visit (HOSPITAL_COMMUNITY): Payer: Self-pay

## 2021-10-28 ENCOUNTER — Encounter: Payer: Self-pay | Admitting: Neurology

## 2021-10-28 ENCOUNTER — Ambulatory Visit (INDEPENDENT_AMBULATORY_CARE_PROVIDER_SITE_OTHER): Payer: 59 | Admitting: Neurology

## 2021-10-28 ENCOUNTER — Other Ambulatory Visit (HOSPITAL_COMMUNITY): Payer: Self-pay

## 2021-10-28 VITALS — BP 136/95 | HR 91 | Ht 64.0 in | Wt 225.0 lb

## 2021-10-28 DIAGNOSIS — R0681 Apnea, not elsewhere classified: Secondary | ICD-10-CM | POA: Diagnosis not present

## 2021-10-28 DIAGNOSIS — F519 Sleep disorder not due to a substance or known physiological condition, unspecified: Secondary | ICD-10-CM | POA: Insufficient documentation

## 2021-10-28 DIAGNOSIS — G4726 Circadian rhythm sleep disorder, shift work type: Secondary | ICD-10-CM

## 2021-10-28 DIAGNOSIS — Z72820 Sleep deprivation: Secondary | ICD-10-CM | POA: Diagnosis not present

## 2021-10-28 DIAGNOSIS — G4719 Other hypersomnia: Secondary | ICD-10-CM | POA: Insufficient documentation

## 2021-10-28 DIAGNOSIS — G4753 Recurrent isolated sleep paralysis: Secondary | ICD-10-CM

## 2021-10-28 MED ORDER — MODAFINIL 200 MG PO TABS
200.0000 mg | ORAL_TABLET | Freq: Every day | ORAL | 5 refills | Status: DC
  Filled 2021-10-28: qty 30, 30d supply, fill #0
  Filled 2021-12-17: qty 30, 30d supply, fill #1
  Filled 2022-01-31: qty 30, 30d supply, fill #2
  Filled 2022-02-25: qty 30, 30d supply, fill #3

## 2021-10-28 NOTE — Patient Instructions (Addendum)
) shift work sleep disorder/ circadian rhythm  2) OSA high risk by Mallampati grade 3+, the patient also has gained weight since he works night shifts exclusively.  Night shift workers are at higher risk of obesity, prediabetes and hypertension due to the lower quality daytime sleep.    3) excessive daytime sleepinessHis daytime sleep is also fragmented and he has noticed that he tends to choke snore and gasp for air if he sleeps in supine position.   My Plan is to proceed with:  1)Sleep apnea screening  2) excessive daytime sleepiness, even without a d further dx of OSA, this patient would benefit from nighttime modafinil or armodafinil  3) vivid dreaming , sleep paralysis (often,) dreaming every day. If apnea is treated and hypersomnia persists , screen for narcolepsy.   Sleep Apnea Sleep apnea affects breathing during sleep. It causes breathing to stop for 10 seconds or more, or to become shallow. People with sleep apnea usually snore loudly. It can also increase the risk of: Heart attack. Stroke. Being very overweight (obese). Diabetes. Heart failure. Irregular heartbeat. High blood pressure. The goal of treatment is to help you breathe normally again. What are the causes?  The most common cause of this condition is a collapsed or blocked airway. There are three kinds of sleep apnea: Obstructive sleep apnea. This is caused by a blocked or collapsed airway. Central sleep apnea. This happens when the brain does not send the right signals to the muscles that control breathing. Mixed sleep apnea. This is a combination of obstructive and central sleep apnea. What increases the risk? Being overweight. Smoking. Having a small airway. Being older. Being male. Drinking alcohol. Taking medicines to calm yourself (sedatives or tranquilizers). Having family members with the condition. Having a tongue or tonsils that are larger than normal. What are the signs or symptoms? Trouble  staying asleep. Loud snoring. Headaches in the morning. Waking up gasping. Dry mouth or sore throat in the morning. Being sleepy or tired during the day. If you are sleepy or tired during the day, you may also: Not be able to focus your mind (concentrate). Forget things. Get angry a lot and have mood swings. Feel sad (depressed). Have changes in your personality. Have less interest in sex, if you are male. Be unable to have an erection, if you are male. How is this treated?  Sleeping on your side. Using a medicine to get rid of mucus in your nose (decongestant). Avoiding the use of alcohol, medicines to help you relax, or certain pain medicines (narcotics). Losing weight, if needed. Changing your diet. Quitting smoking. Using a machine to open your airway while you sleep, such as: An oral appliance. This is a mouthpiece that shifts your lower jaw forward. A CPAP device. This device blows air through a mask when you breathe out (exhale). An EPAP device. This has valves that you put in each nostril. A BIPAP device. This device blows air through a mask when you breathe in (inhale) and breathe out. Having surgery if other treatments do not work. Follow these instructions at home: Lifestyle Make changes that your doctor recommends. Eat a healthy diet. Lose weight if needed. Avoid alcohol, medicines to help you relax, and some pain medicines. Do not smoke or use any products that contain nicotine or tobacco. If you need help quitting, ask your doctor. General instructions Take over-the-counter and prescription medicines only as told by your doctor. If you were given a machine to use while you sleep,  use it only as told by your doctor. If you are having surgery, make sure to tell your doctor you have sleep apnea. You may need to bring your device with you. Keep all follow-up visits. Contact a doctor if: The machine that you were given to use during sleep bothers you or does not  seem to be working. You do not get better. You get worse. Get help right away if: Your chest hurts. You have trouble breathing in enough air. You have an uncomfortable feeling in your back, arms, or stomach. You have trouble talking. One side of your body feels weak. A part of your face is hanging down. These symptoms may be an emergency. Get help right away. Call your local emergency services (911 in the [place] Do not wait to see if the symptoms will go away. Do not drive yourself to the hospital. Summary This condition affects breathing during sleep. The most common cause is a collapsed or blocked airway. The goal of treatment is to help you breathe normally while you sleep. This information is not intended to replace advice given to you by your health care provider. Make sure you discuss any questions you have with your health care provider. Document Revised: 11/25/2020 Document Reviewed: 03/27/2020 Elsevier Patient Education  2023 Elsevier Inc.    Modafinil Tablets What is this medication? MODAFINIL (moe DAF i nil) treats sleep disorders, such as narcolepsy, obstructive sleep apnea, and shift work disorder. It works by promoting wakefulness. It belongs to a group of medications called stimulants. This medicine may be used for other purposes; ask your health care provider or pharmacist if you have questions. COMMON BRAND NAME(S): Provigil What should I tell my care team before I take this medication? They need to know if you have any of these conditions: Kidney disease Liver disease Mental health conditions An unusual or allergic reaction to modafinil, other medications, foods, dyes, or preservatives Pregnant or trying to get pregnant Breast-feeding How should I use this medication? Take this medication by mouth with water. Take it as directed on the prescription label at the same time every day. You can take it with or without food. If it upsets your stomach, take it with  food. Keep taking it unless your care team tells you to stop. A special MedGuide will be given to you by the pharmacist with each prescription and refill. Be sure to read this information carefully each time. Talk to your care team about the use of this medication in children. Special care may be needed. Overdosage: If you think you have taken too much of this medicine contact a poison control center or emergency room at once. NOTE: This medicine is only for you. Do not share this medicine with others. What if I miss a dose? If you miss a dose, take it as soon as you can. If it is almost time for your next dose, take only that dose. Do not take double or extra doses. What may interact with this medication? Do not take this medication with any of the following: Amphetamine or dextroamphetamine Dexmethylphenidate or methylphenidate MAOIs, such as Marplan, Nardil, and Parnate Pemoline Procarbazine This medication may also interact with the following: Antifungal medications, such as itraconazole or ketoconazole Barbiturates, such as phenobarbital Carbamazepine Cyclosporine Diazepam Estrogen or progestin hormones Medications for mental health conditions Phenytoin Propranolol Triazolam Warfarin This list may not describe all possible interactions. Give your health care provider a list of all the medicines, herbs, non-prescription drugs, or dietary supplements  you use. Also tell them if you smoke, drink alcohol, or use illegal drugs. Some items may interact with your medicine. What should I watch for while using this medication? Visit your care team for regular checks on your progress. It may be some time before you see the benefit from this medication. This medication may affect your coordination, reaction time, or judgment. It may also hide signs that you are tired. This medication will not eliminate your abnormal tendency to fall asleep and is not a replacement for sleep. Do not drive or  operate machinery until you know how this medication affects you. Sit up or stand slowly to reduce the risk of dizzy or fainting spells. Drinking alcohol with this medication can increase the risk of these side effects. This medication may cause serious skin reactions. They can happen weeks to months after starting the medication. Contact your care team right away if you notice fevers or flu-like symptoms with a rash. The rash may be red or purple and then turn into blisters or peeling of the skin. You may also notice a red rash with swelling of the face, lips, or lymph nodes in your neck or under your arms. Estrogen and progestin hormones may not work as well while you are taking this medication. If you are using these hormones for contraception, talk to your care team about using a second type of contraception. A barrier contraceptive, such as a condom or diaphragm, is recommended. It is unknown if the effects of this medication will be increased by the use of caffeine. Caffeine is found in many foods, beverages, and medications. Ask your care team if you should limit or change your intake of caffeine-containing products while on this medication. What side effects may I notice from receiving this medication? Side effects that you should report to your care team as soon as possible: Allergic reactions or angioedema--skin rash, itching or hives, swelling of the face, eyes, lips, tongue, arms, or legs, trouble swallowing or breathing Increase in blood pressure Mood and behavior changes--anxiety, nervousness, confusion, hallucinations, irritability, hostility, thoughts of suicide or self-harm, worsening mood, feelings of depression Rash, fever, and swollen lymph nodes Redness, blistering, peeling, or loosening of the skin, including inside the mouth Side effects that usually do not require medical attention (report to your care team if they continue or are bothersome): Anxiety,  nervousness Dizziness Headache Nausea Trouble sleeping This list may not describe all possible side effects. Call your doctor for medical advice about side effects. You may report side effects to FDA at 1-800-FDA-1088. Where should I keep my medication? Keep out of the reach of children and pets. This medication can be abused. Keep it in a safe place to protect it from theft. Do not share it with anyone. It is only for you. Selling or giving away this medication is dangerous and against the law. Store at room temperature between 20 and 25 degrees C (68 and 77 degrees F). Get rid of any unused medication after the expiration date. This medication may cause harm and death if it is taken by other adults, children, or pets. It is important to get rid of the medication as soon as you no longer need it or it is expired. You can do this in two ways: Take the medication to a medication take-back program. Check with your pharmacy or law enforcement to find a location. If you cannot return the medication, check the label or package insert to see if the medication should be  thrown out in the garbage or flushed down the toilet. If you are not sure, ask your care team. If it is safe to put it in the trash, take the medication out of the container. Mix the medication with cat litter, dirt, coffee grounds, or other unwanted substance. Seal the mixture in a bag or container. Put it in the trash. NOTE: This sheet is a summary. It may not cover all possible information. If you have questions about this medicine, talk to your doctor, pharmacist, or health care provider.  2023 Elsevier/Gold Standard (2021-05-27 00:00:00)

## 2021-10-28 NOTE — Progress Notes (Signed)
SLEEP MEDICINE CLINIC    Provider:  Melvyn Novas, MD  Primary Care Physician:  Trevor Rainwater, MD [address] [place] [address]     Referring Provider: Earl Lagos, Md [address], [address] [place],  [place] [address]          Chief Complaint according to patient   Patient presents with:     New Patient (Initial Visit)           HISTORY OF PRESENT ILLNESS:  Trevor Novak is a 41 y.o. African American male patient seen here as a referral on 10/28/2021 from Trevor Novak, PCP.  Chief concern according to patient :  Pt presents today with concerns for sleep apnea. Pt wakes himself up gasping for air, coughing at times. He has been told he chokes and snores in sleep. Pt is a rotating night shift worker for 6 years, exclusively on nights for 12 months  and only gets <6 hrs of sleep when he sleeps.  States during that time it is broken sleep. He is tired all the time. He gets of work at 7.30 and comes home to bring his boy to preschool, picks him up at 2 Pm.   Trevor Novak  has a past medical history of  reactive airway disease , childhood onset Asthma and Obesity (BMI 35.0-39.9 without comorbidity). Weight gain since he works night shifts. Retrognathia.      Sleep relevant medical history: Nocturia every 2 hours, Sleep walking- automatism, talking. Laughing.     Family medical /sleep history:  late Father had OSA, died of CORONA- 63 in 67, age 68.    Social history:  Patient is working at [employer], Estate manager/land agent, environmental, operates Science writer  and lives in a household with fiancee and son persons  The patient currently works in shifts( Chief Technology Officer,) [employer], mental health.  Tobacco use; smoke daily.  ETOH use ; socially,  Caffeine intake in form of Coffee( 1 mug) Soda( /) Tea ( /)  and energy drinks ( red Bull) . Regular exercise in form of none.        Sleep habits are as follows: The patient's breakfast  time is between 8 AM. The patient goes to bed at 9.30  AM   PM and continues to sleep for intervals of 1.5-2  hours, wakes for 2-3 bathroom breaks The preferred sleep position is sideways, with the support of 2-3 pillows.  Dreams are reportedly frequent/vivid.  Always- Lucid. Continues to dream ,in installments.   Wakes up at 1.30 PM is the usual rise time. The patient wakes up with an alarm.  He reports not feeling refreshed or restored in AM, with symptoms such as dry mouth, morning headaches, and residual fatigue.  Struggles to stay awake during night shift frequently,    PCP : Trevor Novak is a 41 y.o. male with medical history of asthma.  The patient presents to the ED for evaluation of cough that began this past Tuesday.  Patient denies any known sick contacts.  Patient denies being vaccinated for COVID-19 or influenza.  Patient states that he is a 1 pack/day smoker and has done so since "I can remember".  Patient states that his cough is productive however he denies any fevers, body aches or chills, sore throat, headache, nausea, vomiting, abdominal pain or diarrhea.  The patient denies any chest pain or shortness of breath or wheezing.  Review of Systems: Out of a complete 14 system review, the  patient complains of only the following symptoms, and all other reviewed systems are negative.:  Fatigue, sleepiness ,snoring, fragmented sleep, shift worker , sleep choking, nocturia.    How likely are you to doze in the following situations: 0 = not likely, 1 = slight chance, 2 = moderate chance, 3 = high chance   Sitting and Reading? Watching Television? Sitting inactive in a public place (theater or meeting)? As a passenger in a car for an hour without a break? Lying down in the afternoon when circumstances permit? Sitting and talking to someone? Sitting quietly after lunch without alcohol? In a car, while stopped for a few minutes in traffic?   Total = 24/ 24 points   FSS endorsed at 63/ 63 points.   Social History   Socioeconomic  History   Marital status: Single    Spouse name: Not on file   Number of children: Not on file   Years of education: Not on file   Highest education level: Not on file  Occupational History   Not on file  Tobacco Use   Smoking status: Every Day   Smokeless tobacco: Never   Tobacco comments:    0.5 PPD  Vaping Use   Vaping Use: Never used  Substance and Sexual Activity   Alcohol use: Yes    Comment: occasionally    Drug use: No   Sexual activity: Not on file  Other Topics Concern   Not on file  Social History Narrative   Not on file   Social Determinants of Health   Financial Resource Strain: Not on file  Food Insecurity: Not on file  Transportation Needs: Not on file  Physical Activity: Not on file  Stress: Not on file  Social Connections: Not on file    Family History  Problem Relation Age of Onset   Heart failure Mother    Heart failure Father    Hypertension Father    Diabetes Father     Past Medical History:  Diagnosis Date   Asthma    Obesity (BMI 35.0-39.9 without comorbidity)     No past surgical history on file.   Current Outpatient Medications on File Prior to Visit  Medication Sig Dispense Refill   albuterol (VENTOLIN HFA) 108 (90 Base) MCG/ACT inhaler Inhale 2 puffs into the lungs every 4 (four) hours as needed for wheezing or shortness of breath. 18 g 12   buPROPion (WELLBUTRIN SR) 150 MG 12 hr tablet Take 1 tablet (150 mg total) by mouth daily for 3 days, THEN 2 tablets (300 mg total) daily. 60 tablet 0   fluticasone-salmeterol (ADVAIR DISKUS) 100-50 MCG/ACT AEPB Inhale 1 puff into the lungs 2 (two) times daily. 60 each 5   No current facility-administered medications on file prior to visit.    No Known Allergies  Physical exam:  Today's Vitals   10/28/21 1003  BP: (!) 136/95  Pulse: 91  Weight: 225 lb (102.1 kg)  Height: 5\' 4"  (1.626 m)   Body mass index is 38.62 kg/m.   Wt Readings from Last 3 Encounters:  10/28/21 225 lb  (102.1 kg)  10/21/21 227 lb (103 kg)  09/30/21 229 lb (103.9 kg)     Ht Readings from Last 3 Encounters:  10/28/21 5\' 4"  (1.626 m)  09/30/21 5\' 4"  (1.626 m)  08/20/20 5\' 4"  (1.626 m)      General: The patient is awake, alert and appears not in acute distress. The patient is well groomed. Head: Normocephalic, atraumatic. Neck  is supple. Mallampati 3,  neck circumference:16.75 inches .  Nasal airflow  patent.  Retrognathia is  seen.  Dental status: biological  Cardiovascular:  Regular rate and cardiac rhythm by pulse,  without distended neck veins. Respiratory: Lungs are clear to auscultation.  Skin:  Without evidence of ankle edema, or rash. Trunk: The patient's posture is erect.   Neurologic exam : The patient is awake and alert, oriented to place and time.   Memory subjective described as intact.  Attention span & concentration ability appears normal.  Speech is fluent,  without  dysarthria, dysphonia or aphasia.  Mood and affect are appropriate.   Cranial nerves: no loss of smell or taste reported  Pupils are equal and briskly reactive to light. Funduscopic exam deferred..  Extraocular movements in vertical and horizontal planes were intact and without nystagmus. No Diplopia. Visual fields by finger perimetry are intact. Hearing was intact to soft voice and finger rubbing.   Facial sensation intact to fine touch. Facial motor strength is symmetric and tongue and uvula move midline.  Neck ROM : rotation, tilt and flexion extension were normal for age and shoulder shrug was symmetrical.    Motor exam:  Symmetric bulk, tone and ROM.   Normal tone without cog -wheeling, symmetric grip strength .   Sensory:  Fine touch and vibration were tested  and  normal. Fingers sometimes numb, tingling. Proprioception tested in the upper extremities was normal.   Coordination: Rapid alternating movements in the fingers/hands were of normal speed.  The Finger-to-nose maneuver was intact  without evidence of ataxia, dysmetria or tremor.   Gait and station: Patient could rise unassisted from a seated position, walked without assistive device.  Stance is of normal width/ base and the patient turned with 3 steps.  Toe and heel walk were deferred.  Deep tendon reflexes: in the  upper and lower extremities are symmetric and intact.  Babinski response was deferred .        After spending a total time of 46 minutes face to face and additional time for physical and neurologic examination, review of laboratory studies,  personal review of imaging studies, reports and results of other testing and review of referral information / records as far as provided in visit, I have established the following assessments:  1) shift work sleep disorder/ circadian rhythm  2) OSA high risk by Mallampati grade 3+, the patient also has gained weight since he works night shifts exclusively.  Night shift workers are at higher risk of obesity, prediabetes and hypertension due to the lower quality daytime sleep.    3) excessive daytime sleepinessHis daytime sleep is also fragmented and he has noticed that he tends to choke snore and gasp for air if he sleeps in supine position.   My Plan is to proceed with:  1)Sleep apnea screening  2) excessive daytime sleepiness, even without a d further dx of OSA, this patient would benefit from nighttime modafinil or armodafinil  3) vivid dreaming , sleep paralysis (often,) dreaming every day. If apnea is treated and hypersomnia persists , screen for narcolepsy.   I would like to thank Amponsah, Flossie Buffy, MD and Trevor Lagos, Md [address] [place],  [place] [address] for allowing me to meet with and to take care of this pleasant patient.   In short, Omar Koeppen is presenting with EDS, circadian rhythm disorder and obesity, OSA, I plan to follow up either personally or through our NP within 2-3 months.  CC: I will share my notes with  PCP.  Electronically signed by: Trevor Novas, MD 10/28/2021 10:32 AM  [facility] Board certified by The [employer] of Sleep Medicine and Diplomate of the [employer] of Sleep Medicine. Board certified In Neurology through the ABPN, Fellow of the [employer] of Neu[employer]. Medical Director of [facility].

## 2021-11-02 ENCOUNTER — Ambulatory Visit (HOSPITAL_COMMUNITY)
Admission: EM | Admit: 2021-11-02 | Discharge: 2021-11-02 | Disposition: A | Discharge status: Home / Self Care | Payer: 59 | Attending: Internal Medicine | Admitting: Internal Medicine

## 2021-11-02 ENCOUNTER — Encounter (HOSPITAL_COMMUNITY): Payer: Self-pay | Admitting: Emergency Medicine

## 2021-11-02 DIAGNOSIS — L03011 Cellulitis of right finger: Secondary | ICD-10-CM | POA: Diagnosis not present

## 2021-11-02 DIAGNOSIS — M62838 Other muscle spasm: Secondary | ICD-10-CM | POA: Diagnosis not present

## 2021-11-02 DIAGNOSIS — M79644 Pain in right finger(s): Secondary | ICD-10-CM | POA: Diagnosis not present

## 2021-11-02 DIAGNOSIS — M545 Low back pain, unspecified: Secondary | ICD-10-CM | POA: Diagnosis not present

## 2021-11-02 MED ORDER — LIDOCAINE HCL (PF) 1 % IJ SOLN
INTRAMUSCULAR | Status: AC
  Filled 2021-11-02: qty 2

## 2021-11-02 MED ORDER — IBUPROFEN 800 MG PO TABS
ORAL_TABLET | ORAL | Status: AC
  Filled 2021-11-02: qty 1

## 2021-11-02 MED ORDER — SULFAMETHOXAZOLE-TRIMETHOPRIM 800-160 MG PO TABS
1.0000 | ORAL_TABLET | Freq: Two times a day (BID) | ORAL | 0 refills | Status: AC
  Filled 2021-11-02: qty 14, 7d supply, fill #0

## 2021-11-02 MED ORDER — TETANUS-DIPHTH-ACELL PERTUSSIS 5-2.5-18.5 LF-MCG/0.5 IM SUSY: 0.5000 mL | PREFILLED_SYRINGE | Freq: Once | INTRAMUSCULAR | Status: DC

## 2021-11-02 MED ORDER — TIZANIDINE HCL 4 MG PO TABS
4.0000 mg | ORAL_TABLET | Freq: Four times a day (QID) | ORAL | 0 refills | Status: DC | PRN
  Filled 2021-11-02: qty 30, 8d supply, fill #0

## 2021-11-02 MED ORDER — IBUPROFEN 800 MG PO TABS: 800.0000 mg | ORAL_TABLET | Freq: Once | ORAL | Status: DC

## 2021-11-02 MED ORDER — IBUPROFEN 800 MG PO TABS
800.0000 mg | ORAL_TABLET | Freq: Once | ORAL | Status: AC
  Administered 2021-11-02: 800 mg via ORAL

## 2021-11-02 MED ORDER — IBUPROFEN 800 MG PO TABS
800.0000 mg | ORAL_TABLET | Freq: Three times a day (TID) | ORAL | 0 refills | Status: DC
  Filled 2021-11-02: qty 21, 7d supply, fill #0

## 2021-11-02 NOTE — ED Triage Notes (Signed)
Pt having swelling in right middle finger and pain for a few days. Denies injury or drainage  Pt also wants to be seen for lower back pain/spasms while here.

## 2021-11-02 NOTE — Discharge Instructions (Addendum)
Advised to use Epsom salt soaks frequently, 4-5 times throughout the day to help decrease the pain in the finger. Advised take ibuprofen 800 mg 1 every 8 hours as needed for the back and finger pain. Advised take Zanaflex 1 every 8-12 hours as needed for the muscle spasm of the back. Advised follow-up PCP or return to urgent care if symptoms fail to improve.

## 2021-11-02 NOTE — ED Provider Notes (Signed)
[facility]    CSN: [id number] Arrival date & time: 11/02/21  0831      History   Chief Complaint Chief Complaint  Patient presents with   Back Pain   Hand Pain    HPI Trevor Novak is a 41 y.o. male.   41 year old male presents with right finger pain and lower back pain.  Patient relates that he bites his nails on a regular basis and believe that for the past week he has a nail problem in the right third finger which is causing it to become tender and swollen.  Patient relates that the tenderness is located on the right medial aspect of the nail margin there is tenderness to touch, but he has not noticed any drainage.  Patient indicates that ibuprofen and Tylenol tend to decrease the pain.  Patient denies fever chills. Patient also relates that for the past week he has been having lower back pain, localized, feels like more spasm.  Patient relates the pain is worse when he moves, bends, turns.  Patient relates that he has not injured the back, he does get some relief with ibuprofen.  Patient relates that the pain is localized, he does not have any weakness, numbness, or tingling of the lower extremities.  Patient relates he is urinating well without any symptoms.   Back Pain Hand Pain    Past Medical History:  Diagnosis Date   Asthma    Obesity (BMI 35.0-39.9 without comorbidity)     Patient Active Problem List   Diagnosis Date Noted   Sleep deprivation 10/28/2021   Excessive daytime sleepiness 10/28/2021   Circadian rhythm sleep disorder, shift work type 10/28/2021   Recurrent isolated sleep paralysis 10/28/2021   Sleep choking syndrome 10/28/2021   Witnessed episode of apnea 10/28/2021   Encounter for tobacco use cessation counseling 10/01/2021   Healthcare maintenance 08/21/2020   Suspected OSA 08/21/2020   Asthma 08/06/2020   Fatigue 08/06/2020   Obesity (BMI 35.0-39.9 without comorbidity) 08/06/2020    History reviewed. No pertinent surgical  history.     Home Medications    Prior to Admission medications   Medication Sig Start Date End Date Taking? Authorizing Provider  ibuprofen (ADVIL) 800 MG tablet Take 1 tablet (800 mg total) by mouth 3 (three) times daily. 11/02/21  Yes Ellsworth Lennox, PA-C  sulfamethoxazole-trimethoprim (BACTRIM DS) 800-160 MG tablet Take 1 tablet by mouth 2 (two) times daily for 7 days. 11/02/21 11/09/21 Yes Ellsworth Lennox, PA-C  tiZANidine (ZANAFLEX) 4 MG tablet Take 1 tablet (4 mg total) by mouth every 6 (six) hours as needed for muscle spasms. 11/02/21  Yes Ellsworth Lennox, PA-C  albuterol (VENTOLIN HFA) 108 (90 Base) MCG/ACT inhaler Inhale 2 puffs into the lungs every 4 (four) hours as needed for wheezing or shortness of breath. 10/21/21   Steffanie Rainwater, MD  buPROPion (WELLBUTRIN SR) 150 MG 12 hr tablet Take 1 tablet (150 mg total) by mouth daily for 3 days, THEN 2 tablets (300 mg total) daily. 09/30/21 11/03/21  Steffanie Rainwater, MD  fluticasone-salmeterol (ADVAIR DISKUS) 100-50 MCG/ACT AEPB Inhale 1 puff into the lungs 2 (two) times daily. 10/21/21   Steffanie Rainwater, MD  modafinil (PROVIGIL) 200 MG tablet Take 1 tablet (200 mg total) by mouth daily. 10/28/21   Dohmeier, Porfirio Mylar, MD    Family History Family History  Problem Relation Age of Onset   Heart failure Mother    Heart failure Father    Hypertension Father  Diabetes Father     Social History Social History   Tobacco Use   Smoking status: Every Day   Smokeless tobacco: Never   Tobacco comments:    0.5 PPD  Vaping Use   Vaping Use: Never used  Substance Use Topics   Alcohol use: Yes    Comment: occasionally    Drug use: No     Allergies   Patient has no known allergies.   Review of Systems Review of Systems  Musculoskeletal:  Positive for back pain.  Skin:  Positive for wound (right finger pain).     Physical Exam Triage Vital Signs ED Triage Vitals  Enc Vitals Group     BP 11/02/21 0857 (!) 133/93     Pulse Rate  11/02/21 0857 88     Resp 11/02/21 0857 18     Temp 11/02/21 0857 98 F (36.7 C)     Temp Source 11/02/21 0857 Oral     SpO2 11/02/21 0857 96 %     Weight --      Height --      Head Circumference --      Peak Flow --      Pain Score 11/02/21 0856 10     Pain Loc --      Pain Edu? --      Excl. in GC? --    No data found.  Updated Vital Signs BP (!) 133/93 (BP Location: Left Arm)   Pulse 88   Temp 98 F (36.7 C) (Oral)   Resp 18   SpO2 96%   Visual Acuity Right Eye Distance:   Left Eye Distance:   Bilateral Distance:    Right Eye Near:   Left Eye Near:    Bilateral Near:     Physical Exam Constitutional:      Appearance: Normal appearance.  Musculoskeletal:     Lumbar back: Spasms present. No swelling or tenderness. Negative right straight leg raise test and negative left straight leg raise test.     Comments: Back: Skin appears normal, no swelling, full range of motion is normal.  Is palpated along the T12-L1 area.  Skin:    Comments: Right hand: Third digit distally with tenderness palpated along the nail margin, minimal redness and swelling.  Full range of motion is normal, capillary refill normal. Procedure: The third digit lateral nail margin is cleaned with alcohol, anesthetized with 1% lidocaine, the paronychia area is explored with blunt edge of tweezers, purulent material is expressed, nail margin is freed from skin.  No complications.  Neurological:     Mental Status: He is alert.      UC Treatments / Results  Labs (all labs ordered are listed, but only abnormal results are displayed) Labs Reviewed - No data to display  EKG   Radiology No results found.  Procedures Procedures (including critical care time)  Medications Ordered in UC Medications  ibuprofen (ADVIL) tablet 800 mg (800 mg Oral Given 11/02/21 0957)    Initial Impression / Assessment and Plan / UC Course  I have reviewed the triage vital signs and the nursing notes.  Pertinent  labs & imaging results that were available during my care of the patient were reviewed by me and considered in my medical decision making (see chart for details).    Plan: 1.  Advised Epsom salt soaks frequently next several days until finger pain resolves 2.  Advised patient take the Bactrim DS 1 every 12 hours until completed to  treat finger infection 3.  Advised patient to take ibuprofen 800 mg 1 every 8 hours as needed for the back pain and finger pain. 4.  Advised to take Zanaflex 1 every 8-12 hours as needed for lower back spasms. 5.  Advised to follow-up PCP or return to urgent care if symptoms fail to improve.  Final Clinical Impressions(s) / UC Diagnoses   Final diagnoses:  Finger pain, right  Paronychia of finger, right  Acute midline low back pain without sciatica  Muscle spasm     Discharge Instructions      Advised to use Epsom salt soaks frequently, 4-5 times throughout the day to help decrease the pain in the finger. Advised take ibuprofen 800 mg 1 every 8 hours as needed for the back and finger pain. Advised take Zanaflex 1 every 8-12 hours as needed for the muscle spasm of the back. Advised follow-up PCP or return to urgent care if symptoms fail to improve.    ED Prescriptions     Medication Sig Dispense Auth. Provider   ibuprofen (ADVIL) 800 MG tablet Take 1 tablet (800 mg total) by mouth 3 (three) times daily. 21 tablet Ellsworth Lennox, PA-C   tiZANidine (ZANAFLEX) 4 MG tablet Take 1 tablet (4 mg total) by mouth every 6 (six) hours as needed for muscle spasms. 30 tablet Ellsworth Lennox, PA-C   sulfamethoxazole-trimethoprim (BACTRIM DS) 800-160 MG tablet Take 1 tablet by mouth 2 (two) times daily for 7 days. 14 tablet Ellsworth Lennox, PA-C      PDMP not reviewed this encounter.   Ellsworth Lennox, PA-C 11/02/21 [phone]

## 2021-11-03 ENCOUNTER — Other Ambulatory Visit (HOSPITAL_COMMUNITY): Payer: Self-pay

## 2021-11-10 ENCOUNTER — Telehealth: Payer: Self-pay | Admitting: Neurology

## 2021-11-10 NOTE — Telephone Encounter (Signed)
LVM for pt to call back to schedule   [facility] no auth req ref # Doris B on 11/09/21

## 2021-11-11 ENCOUNTER — Encounter: Payer: 59 | Admitting: Dietician

## 2021-11-11 NOTE — Telephone Encounter (Signed)
Patient returned my call he is scheduled at [facility] for 12/05/21 at 8 pm. Mailed packet to the patient.   [facility]- [facility] no auth req ref # Doris B on 11/09/21

## 2021-12-05 ENCOUNTER — Ambulatory Visit (INDEPENDENT_AMBULATORY_CARE_PROVIDER_SITE_OTHER): Payer: 59 | Admitting: Neurology

## 2021-12-05 DIAGNOSIS — G4719 Other hypersomnia: Secondary | ICD-10-CM

## 2021-12-05 DIAGNOSIS — G4753 Recurrent isolated sleep paralysis: Secondary | ICD-10-CM | POA: Diagnosis not present

## 2021-12-05 DIAGNOSIS — R0681 Apnea, not elsewhere classified: Secondary | ICD-10-CM

## 2021-12-05 DIAGNOSIS — Z72820 Sleep deprivation: Secondary | ICD-10-CM

## 2021-12-05 DIAGNOSIS — F519 Sleep disorder not due to a substance or known physiological condition, unspecified: Secondary | ICD-10-CM

## 2021-12-05 DIAGNOSIS — G4726 Circadian rhythm sleep disorder, shift work type: Secondary | ICD-10-CM

## 2021-12-09 ENCOUNTER — Telehealth: Payer: Self-pay | Admitting: Neurology

## 2021-12-09 NOTE — Addendum Note (Signed)
Addended by: Melvyn Novas on: 12/09/2021 12:47 PM   Modules accepted: Orders

## 2021-12-09 NOTE — Procedures (Signed)
[facility] POLYSOMNOGRAPHY INTERPRETATION REPORT    STUDY DATE: 12/05/2021     PATIENT NAME: Trevor Novak   DATE OF BIRTH: Aug 13, 1980  PATIENT ID: [id number]  TYPE OF STUDY: PSG  PHYSICIAN: Melvyn Novas, MD  SCORING TECHNICIAN: [place], RPSGT   INDICATIONS/ HISTORY :  Mr. Zylon Creamer is a 41 year-old African- American male patient. He was seen upon Dr. Charlean Sanfilippo, (PCP) referral for a Sleep Medicine evaluation on 10/28/2021.  Chief concern according to patient :?This patient concerns for sleep apnea, awakes while gasping for air, coughing at times. He has been told he chokes and loudly snores in his sleep. Mr. Pelzer works rotating /night shift for 6 years, exclusively on nights for the last12 months and only gets <6 hours of sleep when he sleeps in daytime. He is tired all the time. 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 He reports fragmented sleep in the interval, and is excessively daytime sleepy for the following hours. He reports vividly dreaming and having experienced sleep paralysis. ? Keahi Mcdill has a past medical history of tobacco use, reactive airway disease, childhood- onset Asthma, Bronchitis and Morbid Obesity, class 2(BMI 35.0-39.9 without comorbidity). Weight gain occurred since he works night shifts. He has notable Retrognathia. Reports frequent Nocturia every 2 hours, Sleep walking- automatism, talking. Laughing.The Epworth Sleepiness Scale was endorsed at 24 out of 24 (scores above or equal to 10 are suggestive of hypersomnolence). The FSS was endorsed at 63/ 63 points as well. Denies depression.   DESCRIPTION: A sleep technologist was in attendance for the duration of the recording. Data collection, scoring, video monitoring, and reporting were performed in compliance with the AASM Manual for the Scoring of Sleep and Associated Events; (Hypopnea is scored based on the criteria  listed in Section VIII D. 1b in the AASM Manual V2.6 using a 4% oxygen desaturation rule or Hypopnea is scored based on the criteria listed in Section VIII D. 1a in the AASM Manual V2.6 using 3% oxygen desaturation and /or arousal rule). A physician certified by the American Board of Sleep Medicine reviewed each epoch of the study.  ADDITIONAL INFORMATION: Height: 64 in Weight: 225 lb (BMI 38) Neck Size: 17" .Mallampati: 3 plus.  Medications: Ventolin HFA, Wellbutrin SR, Advair Diskus, recently Modafinil  FINDINGS:  SLEEP CONTINUITY AND SLEEP ARCHITECTURE: Lights were off at 20:24: and lights on at 05:00: (8.6 hours in bed). Total sleep time was 310.5 minutes (34.1% supine; 65.9% lateral; 0% prone, 4.0% REM sleep), with a decreased sleep efficiency at 60.3%. Sleep latency was normal at 26.5 minutes. REM sleep latency was increased at 106.0 minutes. Of the total sleep time, the percentage of stage N1 sleep was 11.8%, stage N2 sleep was 80%, stage N3 sleep was 4.2%, and REM sleep was 4.0%. There was one Stage REM period observed during this study night, 40 awakenings (known as transitions to Stage W from any sleep stage), and 115 total stage transitions.  Wake after sleep onset (WASO) time accounted for 39.7% or 204.5 minutes.  AROUSAL: There were 60 arousals in total, for an arousal index of 12 arousals/hour. Of these, 38 were identified as respiratory-related arousals (7 /h), 0 were PLM-related arousals (0 /h), and 46 were non-specific arousals (9 /hr) . RESPIRATORY MONITORING: Based on CMS criteria (using a 4% oxygen desaturation rule for scoring hypopneas), there were 0 apneas (0 obstructive; 0 central; 0 mixed),  and 63 hypopneas. Apnea index was 0.0. Hypopnea index was 12.2. The apnea-hypopnea index was 12.2 overall (11.9 supine, 31 non-supine; 28.8 REM, 0.0 supine REM). Based on AASM criteria (using a 3% oxygen desaturation and /or arousal rule for scoring hypopneas), there were 0 apneas (0  obstructive; 0 central; 0 mixed), and 76 hypopneas. Apnea index was 0.0. Hypopnea index was 14.7/h. The apnea-hypopnea index was 14.7/h overall (13.6 supine, 37 non-supine; 33.6 REM, 0.0 supine REM).  There were 0 additional respiratory effort-related arousals (RERAs). The RERA index was 0 events/h. Total respiratory disturbance index (RDI) was 14.7 events/h. RDI results showed: supine RDI 13.6 /h; non-supine RDI 15.3 /h; REM RDI 33.6 /h, supine REM RDI 0.0 /h.  Respiratory events were associated with oxyhemoglobin desaturations ( p02 nadir during sleep :78%) from a mean saturation of 93%.  OXIMETRY: Total sleep time recorded =< 88% O2 saturation was 1.0 minute, or 0.3% of total sleep time.  Snoring was classified as moderately loud.  BODY POSITION: Duration of total sleep and percent of total sleep in their respective position is as follows: supine 106 minutes (34%), non-supine 205 minutes (66%); right 194 minutes (63%), left 10 minutes (3%), and prone 00 minutes (0%). Total supine REM sleep time was 1 minute (8% of total REM sleep).  LIMB MOVEMENTS: There were 0 periodic limb movements of sleep (0.0/h).  EEG: Normal sleep EEG.   CARDIAC: The EKG remained in Normal Sinus Rhythm. The average heart rate during sleep was 78 bpm. The heart rate during sleep varied between 66- 98 bpm. The maximum heart rate during recording was 104 bpm.   AUDIO/ VIDEO: There was no unusual complex or repetitive movement present, no vocalization, no parasomnia activity.   IMPRESSIONS:   1) Extremely fragmented sleep was noted, there was a low overall sleep efficiency defined as proportion of actual sleep time during the total recording time with proportionally decreased REM and NREM stage 3 proportion for a patient of this age. This can be partially attributed to night shift sleep disorder.  2) OSA: snoring and hypopneas are present, the overall AHI and RDI are not sleep position dependent but high enough to warrant  treatment. There was no prolonged oxygen desaturation ( hypoxia time ) but frequent oxygen desaturation by 3 and 4 % . 3) No sleep talking or walking was recorded.   RECOMMENDATIONS:  1) Circadian rhythm disorder, shift work type. the use of modafinil was initiated after the patient presented with excessively high daytime sleepiness and fatigue. If tolerated well, I will for now continue with this medication. Recommend to reduce caffeine intake and energy drinks due to cardiac side effects.  2) OSA:  I recommend a treatment by either dental device or CPAP. CPAP as a trial should come first, by ResMed auto-titration device with a pressure range from 5-15 cm water pressure with 2 cm EPR, heated humidification, he likely will need a chin strap. This patient may need special attention to mask/ interface selection due to retrognathia.  3) Risk factor for OSA are retrognathia and narrow upper airway, but also the BMI. Regular exercise and diet is strongly recommended . Weight loss with medical supervision should be offered.  Smoking cessation is important for cerebral, cardio, pulmonary health.  Please arrange for a RV with me or NP in our [facility] after 60-90 days on CPAP, re-evaluating Epworth Score and possible need to investigate Hypersomnia further( if persistent).   Melvyn Novas, MD Medical director of [employer], Member  of the [employer], American Academy of Neurology , Diplomate of the ABPN, ABSM

## 2021-12-09 NOTE — Telephone Encounter (Signed)
-----   Message from Melvyn Novas, MD sent at 12/09/2021 12:47 PM EDT ----- 1) Extremely fragmented sleep was noted, there was a low overall sleep efficiency defined as proportion of actual sleep time during the total recording time with proportionally decreased REM and NREM stage 3 proportion for a patient of this age. This can be partially attributed to night shift sleep disorder.  2) OSA: snoring and hypopneas are present, the overall AHI and RDI are not sleep position dependent but high enough to warrant treatment. There was no prolonged oxygen desaturation ( hypoxia time ) but frequent oxygen desaturation by 3 and 4 % . 3) No sleep talking or walking was recorded.   RECOMMENDATIONS:  1) Circadian rhythm disorder, shift work type. the use of modafinil was initiated after the patient presented with excessively high daytime sleepiness and fatigue. If tolerated well, I will for now continue with this medication. Recommend to reduce caffeine intake and energy drinks due to cardiac side effects.  2) OSA:  I recommend a treatment by either dental device or CPAP. CPAP as a trial should come first, by ResMed auto-titration device with a pressure range from 5-15 cm water pressure with 2 cm EPR, heated humidification, he likely will need a chin strap. This patient may need special attention to mask/ interface selection due to retrognathia.  3) Risk factor for OSA are retrognathia and narrow upper airway, but also the BMI. Regular exercise and diet is strongly recommended . Weight loss with medical supervision should be offered.  Smoking cessation is important for cerebral, cardio, pulmonary health.  Please arrange for a RV with me or NP in our [facility] after 60-90 days on CPAP, re-evaluating Epworth Score and possible need to investigate Hypersomnia further( if persistent).   Melvyn Novas, MD

## 2021-12-09 NOTE — Telephone Encounter (Signed)
I called pt. I advised pt that Dr. Vickey Huger reviewed their sleep study results and found that pt has mild apnea. Dr. Vickey Huger recommends that pt starts auto CPAP. I reviewed PAP compliance expectations with the pt. Pt is agreeable to starting a CPAP. I advised pt that an order will be sent to a DME, [facility] and [facility]  will call the pt within about one week after they file with the pt's insurance. [facility]  will show the pt how to use the machine, fit for masks, and troubleshoot the CPAP if needed. A follow up appt was made for insurance purposes with Dr. Vickey Huger on 02/28/2022 at 10:30 am. Pt verbalized understanding to arrive 15 minutes early and bring their CPAP. A letter with all of this information in it will be mailed to the pt as a reminder. I verified with the pt that the address we have on file is correct. Pt verbalized understanding of results. Pt had no questions at this time but was encouraged to call back if questions arise. I have sent the order to [facility]  and have received confirmation that they have received the order.

## 2021-12-13 NOTE — Progress Notes (Signed)
New diagnosis of OSA and circadian rhythm disorder, shift work type. CPAP has been ordered by Neurology with plan for follow up with Dr. Vickey Huger on 02/28/2022 at 10:30 am.

## 2021-12-17 ENCOUNTER — Other Ambulatory Visit (HOSPITAL_COMMUNITY): Payer: Self-pay

## 2021-12-17 ENCOUNTER — Other Ambulatory Visit: Payer: Self-pay | Admitting: Student

## 2021-12-17 MED ORDER — ALBUTEROL SULFATE HFA 108 (90 BASE) MCG/ACT IN AERS
2.0000 | INHALATION_SPRAY | RESPIRATORY_TRACT | 11 refills | Status: DC | PRN
  Filled 2021-12-17: qty 6.7, 17d supply, fill #0
  Filled 2022-01-13: qty 6.7, 17d supply, fill #1
  Filled 2022-01-31: qty 6.7, 17d supply, fill #2
  Filled 2022-02-25: qty 6.7, 17d supply, fill #3
  Filled 2022-03-17: qty 6.7, 17d supply, fill #4
  Filled 2022-04-04: qty 6.7, 17d supply, fill #5
  Filled 2022-04-20: qty 6.7, 17d supply, fill #6
  Filled 2022-05-16: qty 6.7, 17d supply, fill #7
  Filled 2022-06-06: qty 6.7, 17d supply, fill #8
  Filled 2022-07-05: qty 6.7, 17d supply, fill #9
  Filled 2022-07-22 (×2): qty 6.7, 17d supply, fill #10
  Filled 2022-08-19: qty 6.7, 17d supply, fill #11

## 2022-01-13 ENCOUNTER — Other Ambulatory Visit (HOSPITAL_COMMUNITY): Payer: Self-pay

## 2022-01-19 ENCOUNTER — Other Ambulatory Visit: Payer: Self-pay | Admitting: Student

## 2022-01-19 MED ORDER — NICOTINE 21 MG/24HR TD PT24
21.0000 mg | MEDICATED_PATCH | TRANSDERMAL | 1 refills | Status: DC
  Filled 2022-01-19: qty 28, 28d supply, fill #0
  Filled 2022-02-25: qty 28, 28d supply, fill #1

## 2022-01-19 NOTE — Progress Notes (Signed)
Patient still interested in smoking cessation however, he states that he does not like taking medications and thus have not taken the Wellbutrin that was prescribed.  He is interested in trying the nicotine patch. He smokes about a pack of cigarettes per day. -Discontinue Wellbutrin -Ordered nicotine patch 21 mg/24h

## 2022-01-20 ENCOUNTER — Other Ambulatory Visit (HOSPITAL_COMMUNITY): Payer: Self-pay

## 2022-02-01 ENCOUNTER — Other Ambulatory Visit (HOSPITAL_COMMUNITY): Payer: Self-pay

## 2022-02-24 ENCOUNTER — Telehealth: Payer: Self-pay | Admitting: Neurology

## 2022-02-24 NOTE — Telephone Encounter (Signed)
Called the patient about upcoming apt scheduled 10/30 scheduled for initial cpap. There was no answer.  LVM advising the patient that the apt was for a initial cpap visit and I was unable to locate where he was set up with a machine. LVM asking the patient to bring machine and power cord if he did start the machine and if not we would need to push the apt out to allow time for him to get the machine and get used to using it.

## 2022-02-25 ENCOUNTER — Other Ambulatory Visit (HOSPITAL_COMMUNITY): Payer: Self-pay

## 2022-02-28 ENCOUNTER — Encounter: Payer: Self-pay | Admitting: Neurology

## 2022-02-28 ENCOUNTER — Ambulatory Visit: Payer: 59 | Admitting: Neurology

## 2022-03-17 ENCOUNTER — Other Ambulatory Visit (HOSPITAL_COMMUNITY): Payer: Self-pay

## 2022-04-04 ENCOUNTER — Other Ambulatory Visit (HOSPITAL_COMMUNITY): Payer: Self-pay

## 2022-04-21 ENCOUNTER — Other Ambulatory Visit (HOSPITAL_COMMUNITY): Payer: Self-pay

## 2022-06-15 ENCOUNTER — Other Ambulatory Visit (HOSPITAL_COMMUNITY): Payer: Self-pay

## 2022-06-15 MED ORDER — AMOXICILLIN 500 MG PO CAPS
500.0000 mg | ORAL_CAPSULE | Freq: Three times a day (TID) | ORAL | 0 refills | Status: DC
  Filled 2022-06-15: qty 21, 7d supply, fill #0

## 2022-06-15 MED ORDER — IBUPROFEN 800 MG PO TABS
800.0000 mg | ORAL_TABLET | Freq: Four times a day (QID) | ORAL | 2 refills | Status: DC | PRN
  Filled 2022-06-15: qty 20, 5d supply, fill #0

## 2022-07-22 ENCOUNTER — Other Ambulatory Visit: Payer: Self-pay

## 2022-07-22 ENCOUNTER — Other Ambulatory Visit (HOSPITAL_COMMUNITY): Payer: Self-pay

## 2022-07-22 MED ORDER — IBUPROFEN 800 MG PO TABS
800.0000 mg | ORAL_TABLET | Freq: Four times a day (QID) | ORAL | 2 refills | Status: DC | PRN
  Filled 2022-07-22: qty 20, 5d supply, fill #0
  Filled 2022-07-25: qty 20, 5d supply, fill #1

## 2022-07-22 MED ORDER — AMOXICILLIN 500 MG PO CAPS
500.0000 mg | ORAL_CAPSULE | Freq: Three times a day (TID) | ORAL | 0 refills | Status: DC
  Filled 2022-07-22: qty 21, 7d supply, fill #0

## 2022-07-26 ENCOUNTER — Other Ambulatory Visit (HOSPITAL_COMMUNITY): Payer: Self-pay

## 2022-08-12 ENCOUNTER — Other Ambulatory Visit (HOSPITAL_COMMUNITY): Payer: Self-pay

## 2022-08-19 ENCOUNTER — Other Ambulatory Visit (HOSPITAL_COMMUNITY): Payer: Self-pay

## 2022-08-22 ENCOUNTER — Other Ambulatory Visit (HOSPITAL_COMMUNITY): Payer: Self-pay

## 2022-09-07 ENCOUNTER — Other Ambulatory Visit (HOSPITAL_COMMUNITY): Payer: Self-pay

## 2022-09-23 ENCOUNTER — Other Ambulatory Visit (HOSPITAL_COMMUNITY): Payer: Self-pay

## 2022-09-26 ENCOUNTER — Encounter: Payer: Self-pay | Admitting: *Deleted

## 2022-10-07 ENCOUNTER — Other Ambulatory Visit (HOSPITAL_COMMUNITY): Payer: Self-pay

## 2022-10-27 ENCOUNTER — Other Ambulatory Visit (HOSPITAL_COMMUNITY): Payer: Self-pay

## 2022-10-27 ENCOUNTER — Other Ambulatory Visit: Payer: Self-pay | Admitting: Student

## 2022-10-27 MED ORDER — ALBUTEROL SULFATE HFA 108 (90 BASE) MCG/ACT IN AERS
2.0000 | INHALATION_SPRAY | RESPIRATORY_TRACT | 12 refills | Status: AC | PRN
  Filled 2022-10-27: qty 6.7, 25d supply, fill #0
  Filled 2022-11-30: qty 6.7, 25d supply, fill #1
  Filled 2022-12-19: qty 6.7, 25d supply, fill #2
  Filled 2023-01-12: qty 6.7, 25d supply, fill #3
  Filled 2023-02-09: qty 6.7, 25d supply, fill #4
  Filled 2023-03-06: qty 6.7, 25d supply, fill #5
  Filled 2023-04-06: qty 6.7, 25d supply, fill #6
  Filled 2023-04-27: qty 6.7, 25d supply, fill #7
  Filled 2023-05-24: qty 6.7, 25d supply, fill #8
  Filled 2023-06-14: qty 6.7, 25d supply, fill #9
  Filled 2023-06-29: qty 6.7, 25d supply, fill #10

## 2022-10-27 NOTE — Telephone Encounter (Signed)
Front desk, please call him to set up a follow up appointment for him.

## 2022-11-30 ENCOUNTER — Other Ambulatory Visit (HOSPITAL_COMMUNITY): Payer: Self-pay

## 2022-12-19 ENCOUNTER — Other Ambulatory Visit (HOSPITAL_COMMUNITY): Payer: Self-pay

## 2022-12-20 ENCOUNTER — Other Ambulatory Visit (HOSPITAL_COMMUNITY): Payer: Self-pay

## 2023-01-12 ENCOUNTER — Other Ambulatory Visit (HOSPITAL_COMMUNITY): Payer: Self-pay

## 2023-02-09 ENCOUNTER — Other Ambulatory Visit (HOSPITAL_COMMUNITY): Payer: Self-pay

## 2023-03-06 ENCOUNTER — Other Ambulatory Visit (HOSPITAL_COMMUNITY): Payer: Self-pay

## 2023-04-06 ENCOUNTER — Other Ambulatory Visit (HOSPITAL_COMMUNITY): Payer: Self-pay

## 2023-04-19 ENCOUNTER — Other Ambulatory Visit (HOSPITAL_COMMUNITY): Payer: Self-pay

## 2023-04-19 ENCOUNTER — Ambulatory Visit (HOSPITAL_COMMUNITY)
Admission: EM | Admit: 2023-04-19 | Discharge: 2023-04-19 | Disposition: A | Discharge status: Home / Self Care | Payer: 59 | Attending: Emergency Medicine | Admitting: Emergency Medicine

## 2023-04-19 ENCOUNTER — Encounter (HOSPITAL_COMMUNITY): Payer: Self-pay

## 2023-04-19 DIAGNOSIS — J4520 Mild intermittent asthma, uncomplicated: Secondary | ICD-10-CM | POA: Diagnosis not present

## 2023-04-19 DIAGNOSIS — J069 Acute upper respiratory infection, unspecified: Secondary | ICD-10-CM

## 2023-04-19 MED ORDER — PROMETHAZINE-DM 6.25-15 MG/5ML PO SYRP
5.0000 mL | ORAL_SOLUTION | Freq: Four times a day (QID) | ORAL | 0 refills | Status: AC | PRN
  Filled 2023-04-19: qty 240, 12d supply, fill #0

## 2023-04-19 MED ORDER — PREDNISONE 20 MG PO TABS
40.0000 mg | ORAL_TABLET | Freq: Every day | ORAL | 0 refills | Status: AC
  Filled 2023-04-19: qty 6, 3d supply, fill #0

## 2023-04-19 NOTE — ED Triage Notes (Signed)
Patient reports cough and congestion x 3 days.

## 2023-04-19 NOTE — ED Provider Notes (Signed)
[facility]    CSN: [id number] Arrival date & time: 04/19/23  1502     History   Chief Complaint Chief Complaint  Patient presents with   Cough   Sore Throat   Nasal Congestion    HPI Trevor Novak is a 42 y.o. male.  2-3 day history of dry cough, scratchy throat Denies nasal congestion or drainage No shortness of breath or wheezing. Some tightness in chest with cough. Reports he is a smoker and uses albuterol daily. Asthma history. No other meds taken  No fevers Works for [employer], possible sick contacts   Past Medical History:  Diagnosis Date   Asthma    Obesity (BMI 35.0-39.9 without comorbidity)     Patient Active Problem List   Diagnosis Date Noted   Sleep deprivation 10/28/2021   Excessive daytime sleepiness 10/28/2021   Circadian rhythm sleep disorder, shift work type 10/28/2021   Recurrent isolated sleep paralysis 10/28/2021   Sleep choking syndrome 10/28/2021   Witnessed episode of apnea 10/28/2021   Encounter for tobacco use cessation counseling 10/01/2021   Healthcare maintenance 08/21/2020   Suspected OSA 08/21/2020   Asthma 08/06/2020   Fatigue 08/06/2020   Obesity (BMI 35.0-39.9 without comorbidity) 08/06/2020    History reviewed. No pertinent surgical history.     Home Medications    Prior to Admission medications   Medication Sig Start Date End Date Taking? Authorizing Provider  albuterol (VENTOLIN HFA) 108 (90 Base) MCG/ACT inhaler Inhale 2 puffs into the lungs every 4 (four) hours as needed for wheezing or shortness of breath. 10/27/22  Yes Amponsah, Flossie Buffy, MD  predniSONE (DELTASONE) 20 MG tablet Take 2 tablets (40 mg total) by mouth daily with breakfast for 3 days. 04/19/23 04/22/23 Yes Ezri Fanguy, Lurena Joiner, PA-C  promethazine-dextromethorphan (PROMETHAZINE-DM) 6.25-15 MG/5ML syrup Take 5 mLs by mouth 4 (four) times daily as needed for cough. 04/19/23  Yes Jerzie Bieri, Lurena Joiner, PA-C    Family History Family History  Problem Relation Age  of Onset   Heart failure Mother    Heart failure Father    Hypertension Father    Diabetes Father     Social History Social History   Tobacco Use   Smoking status: Every Day   Smokeless tobacco: Never   Tobacco comments:    0.5 PPD  Vaping Use   Vaping status: Never Used  Substance Use Topics   Alcohol use: Yes    Comment: occasionally    Drug use: No     Allergies   Patient has no known allergies.   Review of Systems Review of Systems  Respiratory:  Positive for cough.    Per HPI  Physical Exam Triage Vital Signs ED Triage Vitals [04/19/23 1612]  Encounter Vitals Group     BP (!) 147/97     Systolic BP Percentile      Diastolic BP Percentile      Pulse Rate (!) 101     Resp 18     Temp 98.3 F (36.8 C)     Temp Source Oral     SpO2      Weight      Height      Head Circumference      Peak Flow      Pain Score      Pain Loc      Pain Education      Exclude from Growth Chart    No data found.  Updated Vital Signs BP (!) 147/97 (BP Location: Left  Arm)   Pulse 92   Temp 98.3 F (36.8 C) (Oral)   Resp 18   SpO2 97%   Physical Exam Vitals and nursing note reviewed.  Constitutional:      General: He is not in acute distress.    Appearance: He is not ill-appearing.  HENT:     Right Ear: Tympanic membrane and ear canal normal.     Left Ear: Tympanic membrane and ear canal normal.     Nose: No congestion or rhinorrhea.     Mouth/Throat:     Mouth: Mucous membranes are moist.     Pharynx: Oropharynx is clear. No posterior oropharyngeal erythema.  Eyes:     Conjunctiva/sclera: Conjunctivae normal.  Cardiovascular:     Rate and Rhythm: Normal rate and regular rhythm.     Pulses: Normal pulses.     Heart sounds: Normal heart sounds.  Pulmonary:     Effort: Pulmonary effort is normal. No respiratory distress.     Breath sounds: Normal breath sounds. No wheezing or rales.     Comments: Clear lungs, slight dry cough infrequently in clinic.   Musculoskeletal:     Cervical back: Normal range of motion.  Lymphadenopathy:     Cervical: No cervical adenopathy.  Skin:    General: Skin is warm and dry.  Neurological:     Mental Status: He is alert and oriented to person, place, and time.    UC Treatments / Results  Labs (all labs ordered are listed, but only abnormal results are displayed) Labs Reviewed - No data to display  EKG  Radiology No results found.  Procedures Procedures   Medications Ordered in UC Medications - No data to display  Initial Impression / Assessment and Plan / UC Course  I have reviewed the triage vital signs and the nursing notes.  Pertinent labs & imaging results that were available during my care of the patient were reviewed by me and considered in my medical decision making (see chart for details).  Clear lungs, afebrile, well appearing No indication for xray imaging today Advised symptomatic care; can continue inhaler as needed. No wheezing today but with chest tightness during cough will treat with prednisone 40 mg x 3 days. Promethazine DM QID prn. Advised return precautions. Patient agrees to plan, all questions answered.  Patient asking about antibiotics, at this time they are not indicated. However if he feels he acutely worsens I recommend to please return for re-evaluation   Final Clinical Impressions(s) / UC Diagnoses   Final diagnoses:  Viral URI with cough  Mild intermittent asthma without complication     Discharge Instructions      Continue inhaler twice daily or as needed Prednisone daily x 3 days The promethazine DM cough syrup can be used up to 4 times daily. If this medication makes you drowsy, take only once before bed. Please return if you feel symptoms worsen over the next 5 to 6 days of symptomatic care     ED Prescriptions     Medication Sig Dispense Auth. Provider   promethazine-dextromethorphan (PROMETHAZINE-DM) 6.25-15 MG/5ML syrup Take 5 mLs by mouth  4 (four) times daily as needed for cough. 240 mL Aleczander Fandino, PA-C   predniSONE (DELTASONE) 20 MG tablet Take 2 tablets (40 mg total) by mouth daily with breakfast for 3 days. 6 tablet Quinterrius Errington, Lurena Joiner, PA-C      PDMP not reviewed this encounter.   Marlow Baars, [place] 04/19/23 1659

## 2023-04-19 NOTE — Discharge Instructions (Addendum)
Continue inhaler twice daily or as needed Prednisone daily x 3 days The promethazine DM cough syrup can be used up to 4 times daily. If this medication makes you drowsy, take only once before bed. Please return if you feel symptoms worsen over the next 5 to 6 days of symptomatic care

## 2023-04-27 ENCOUNTER — Other Ambulatory Visit (HOSPITAL_COMMUNITY): Payer: Self-pay

## 2023-05-18 ENCOUNTER — Other Ambulatory Visit (HOSPITAL_COMMUNITY): Payer: Self-pay

## 2023-05-18 DIAGNOSIS — B001 Herpesviral vesicular dermatitis: Secondary | ICD-10-CM | POA: Diagnosis not present

## 2023-05-18 DIAGNOSIS — R030 Elevated blood-pressure reading, without diagnosis of hypertension: Secondary | ICD-10-CM | POA: Diagnosis not present

## 2023-05-18 MED ORDER — VALACYCLOVIR HCL 500 MG PO TABS
500.0000 mg | ORAL_TABLET | Freq: Two times a day (BID) | ORAL | 0 refills | Status: AC
  Filled 2023-05-18 (×2): qty 14, 7d supply, fill #0

## 2023-05-24 ENCOUNTER — Other Ambulatory Visit (HOSPITAL_COMMUNITY): Payer: Self-pay

## 2023-05-24 ENCOUNTER — Other Ambulatory Visit: Payer: Self-pay

## 2023-06-14 ENCOUNTER — Other Ambulatory Visit (HOSPITAL_COMMUNITY): Payer: Self-pay

## 2023-06-29 ENCOUNTER — Other Ambulatory Visit (HOSPITAL_COMMUNITY): Payer: Self-pay

## 2023-07-04 ENCOUNTER — Other Ambulatory Visit (HOSPITAL_COMMUNITY): Payer: Self-pay

## 2023-07-05 ENCOUNTER — Other Ambulatory Visit (HOSPITAL_COMMUNITY): Payer: Self-pay

## 2023-07-11 ENCOUNTER — Other Ambulatory Visit (HOSPITAL_COMMUNITY): Payer: Self-pay

## 2023-07-20 ENCOUNTER — Emergency Department (HOSPITAL_COMMUNITY)
Admission: EM | Admit: 2023-07-20 | Discharge: 2023-07-20 | Disposition: A | Discharge status: Home / Self Care | Attending: Emergency Medicine | Admitting: Emergency Medicine

## 2023-07-20 ENCOUNTER — Other Ambulatory Visit (HOSPITAL_COMMUNITY): Payer: Self-pay

## 2023-07-20 ENCOUNTER — Encounter (HOSPITAL_COMMUNITY): Payer: Self-pay | Admitting: Emergency Medicine

## 2023-07-20 ENCOUNTER — Other Ambulatory Visit: Payer: Self-pay

## 2023-07-20 DIAGNOSIS — M545 Low back pain, unspecified: Secondary | ICD-10-CM | POA: Insufficient documentation

## 2023-07-20 DIAGNOSIS — Z87891 Personal history of nicotine dependence: Secondary | ICD-10-CM | POA: Diagnosis not present

## 2023-07-20 DIAGNOSIS — J45909 Unspecified asthma, uncomplicated: Secondary | ICD-10-CM | POA: Insufficient documentation

## 2023-07-20 MED ORDER — LIDOCAINE 5 % EX PTCH
2.0000 | MEDICATED_PATCH | CUTANEOUS | Status: DC
  Administered 2023-07-20: 2 via TRANSDERMAL
  Filled 2023-07-20: qty 2

## 2023-07-20 MED ORDER — METHOCARBAMOL 500 MG PO TABS
500.0000 mg | ORAL_TABLET | Freq: Four times a day (QID) | ORAL | 0 refills | Status: AC | PRN
  Filled 2023-07-20: qty 20, 5d supply, fill #0

## 2023-07-20 MED ORDER — LIDOCAINE 5 % EX PTCH
1.0000 | MEDICATED_PATCH | Freq: Every day | CUTANEOUS | 0 refills | Status: AC | PRN
  Filled 2023-07-20: qty 10, 10d supply, fill #0

## 2023-07-20 MED ORDER — ACETAMINOPHEN 325 MG PO TABS
650.0000 mg | ORAL_TABLET | Freq: Once | ORAL | Status: AC
  Administered 2023-07-20: 650 mg via ORAL
  Filled 2023-07-20: qty 2

## 2023-07-20 MED ORDER — METHOCARBAMOL 500 MG PO TABS: 500.0000 mg | ORAL_TABLET | Freq: Four times a day (QID) | ORAL | 0 refills | Status: DC | PRN

## 2023-07-20 NOTE — ED Provider Notes (Signed)
 [place] EMERGENCY DEPARTMENT AT [facility] Provider Note   CSN: [id number] Arrival date & time: 07/20/23  0604     History  Chief Complaint  Patient presents with   Back Pain    Trevor Novak is a 43 y.o. male with a hx of asthma and tobacco use who presents to the ED with complaints of back pain that began overnight. Patient reports he woke from sleep sometime between 10pm-12am with pain to the left lower back. Pain has been constant, worse with twisting/turning motions and other movements, no alleviating factors. No intervention PTA. Thinks he may have turned wrong in his sleep but no specific injury he recalls, no change in activity recently. Denies numbness, tingling, weakness, saddle anesthesia, incontinence to bowel/bladder, fever, chills, IV drug use, dysuria, or hx of cancer. Patient has not had prior back surgeries.   HPI     Home Medications Prior to Admission medications   Medication Sig Start Date End Date Taking? Authorizing Provider  albuterol (VENTOLIN HFA) 108 (90 Base) MCG/ACT inhaler Inhale 2 puffs into the lungs every 4 (four) hours as needed for wheezing or shortness of breath. 10/27/22   Steffanie Rainwater, MD  promethazine-dextromethorphan (PROMETHAZINE-DM) 6.25-15 MG/5ML syrup Take 5 mLs by mouth 4 (four) times daily as needed for cough. 04/19/23   Rising, Lurena Joiner, PA-C      Allergies    Patient has no known allergies.    Review of Systems   Review of Systems  Constitutional:  Negative for chills, fever and unexpected weight change.  Gastrointestinal:  Negative for abdominal pain, nausea and vomiting.  Genitourinary:  Negative for dysuria and hematuria.  Musculoskeletal:  Positive for back pain.  Skin:  Negative for rash.  Neurological:  Negative for weakness and numbness.       Negative for saddle anesthesia or bowel/bladder incontinence.   All other systems reviewed and are negative.   Physical Exam Updated Vital Signs BP (!) 140/103    Pulse 87   Temp (!) 97.5 F (36.4 C)   Resp 18   Ht 5\' 4"  (1.626 m)   Wt 102.1 kg   SpO2 99%   BMI 38.62 kg/m  Physical Exam Vitals and nursing note reviewed.  Constitutional:      General: He is not in acute distress.    Appearance: He is well-developed. He is not toxic-appearing.  HENT:     Head: Normocephalic and atraumatic.  Cardiovascular:     Rate and Rhythm: Normal rate and regular rhythm.     Comments: Palpable PT pulses  Pulmonary:     Effort: Pulmonary effort is normal. No respiratory distress.     Breath sounds: Normal breath sounds. No wheezing or rales.  Abdominal:     General: There is no distension.     Palpations: Abdomen is soft.     Tenderness: There is no abdominal tenderness.  Musculoskeletal:     Cervical back: Normal range of motion and neck supple. No spinous process tenderness or muscular tenderness.     Comments: No obvious deformity, appreciable swelling, erythema, ecchymosis, significant open wounds, or increased warmth.  Back: No point/focal vertebral tenderness, no palpable step off or crepitus.  Lower extremities: ranging @ all major joints. No focal bony tenderness.   Skin:    General: Skin is warm and dry.     Findings: No rash.  Neurological:     Mental Status: He is alert.     Comments: Sensation grossly intact to bilateral  lower extremities. 5/5 symmetric strength with knee flexion/extension and ankle plantar/dorsiflexion bilaterally. Gait is intact without obvious foot drop.      ED Results / Procedures / Treatments   Labs (all labs ordered are listed, but only abnormal results are displayed) Labs Reviewed - No data to display  EKG None  Radiology No results found.  Procedures Procedures    Medications Ordered in ED Medications - No data to display  ED Course/ Medical Decision Making/ A&P                                 Medical Decision Making Risk OTC drugs. Prescription drug management.   Patient presents to  the ED with complaints of  back pain.  Nontoxic, vitals w/ HTN- do not suspect HTN emergency, advised PCP follow up for this.   Additional history obtained:  Additional history obtained from chart review & nursing note review.  Last multiple Cr > 1, most recently 1.19,  avoiding oral NSAIDs however feel that short course of topical is reasonable- advised OTC, advised PCP recheck for this as well.   ED Course:  Patient has no back pain red flags.  No neurologic deficits, ambulatory- doubt cauda equina syndrome or acute cord compression. Afebrile, no hx of IVDU- doubt epidural abscess. No urinary sxs- feel that UTI/nephrolithiasis. Symmetric pulses, not a tearing sensation, overall well appearing, feel that dissection is unlikely at this time. Favor musculoskeletal pain- likely strain/spasm. Will treat with Naproxen and Robaxin, discussed with patient that they are not to drive or operate heavy machinery while taking Robaxin. I discussed treatment plan, need for PCP follow-up, and return precautions with the patient. Provided opportunity for questions, patient confirmed understanding and is in agreement with plan.   Portions of this note were generated with Scientist, clinical (histocompatibility and immunogenetics). Dictation errors may occur despite best attempts at proofreading.    Final Clinical Impression(s) / ED Diagnoses Final diagnoses:  Acute left-sided low back pain without sciatica    Rx / DC Orders ED Discharge Orders          Ordered    lidocaine (LIDODERM) 5 %  Daily PRN        07/20/23 0728    methocarbamol (ROBAXIN) 500 MG tablet  Every 6 hours PRN        07/20/23 0728              Adena Sima, Pleas Koch, PA-C 07/20/23 0805    Pricilla Loveless, MD 07/20/23 [phone]

## 2023-07-20 NOTE — ED Triage Notes (Signed)
 Pt c/o left sided lower back pain near ribs, denies radiation, no urinary symptoms, no fevers, pain is worse with coughing and movement, denies injury

## 2023-07-20 NOTE — Discharge Instructions (Addendum)
 You were seen in the emergency department for back pain today.  At this time we suspect that your pain is related to a muscle strain/spasm.   I have prescribed you an anti-inflammatory medication and a muscle relaxer.  - Robaxin- this is the muscle relaxer I have prescribed, this is meant to help with muscle tightness/spasms. Be aware that this medication may make you drowsy therefore the first time you take this it should be at a time you are in an environment where you can rest. Do not drive or operate heavy machinery when taking this medication. Do not drink alcohol or take other sedating medications with this medicine such as narcotics or benzodiazepines.   - Lidoderm patch- Apply 1 patch to your area of most significant pain once per day to help numb/soothe this area. Remove & discard patch within 12 hours of application.   You make take Tylenol per over the counter dosing with these medications.  You may also utilize over the counter Topical Diclofenac (anti-inflammatory gel to help with pain/swelling) alternating every other day with the lidoderm patches---> if the patches are significantly expensive please discuss the over the counter alternative with the pharmacist.   We have prescribed you new medication(s) today. Discuss the medications prescribed today with your pharmacist as they can have adverse effects and interactions with your other medicines including over the counter and prescribed medications. Seek medical evaluation if you start to experience new or abnormal symptoms after taking one of these medicines, seek care immediately if you start to experience difficulty breathing, feeling of your throat closing, facial swelling, or rash as these could be indications of a more serious allergic reaction   Please follow-up with primary care within 1-2 week for reevaluation of your back pain and a recheck of your blood pressure as it was elevated in the ER today.  Return to the ER for any new  or worsening symptoms including but not limited to new or worsening pain, fever, numbness/weakness, loss of control bowel or bladder function, inability to walk, or any other concerns.

## 2023-08-01 ENCOUNTER — Other Ambulatory Visit (HOSPITAL_COMMUNITY): Payer: Self-pay

## 2023-08-19 IMAGING — CR DG CHEST 2V
2 series · 2 of 2 positions shown · non-contrast
Comparison: Chest x-ray dated 11/12/2020

CLINICAL DATA: Cough

EXAM:
CHEST - 2 VIEW

[chest pa]
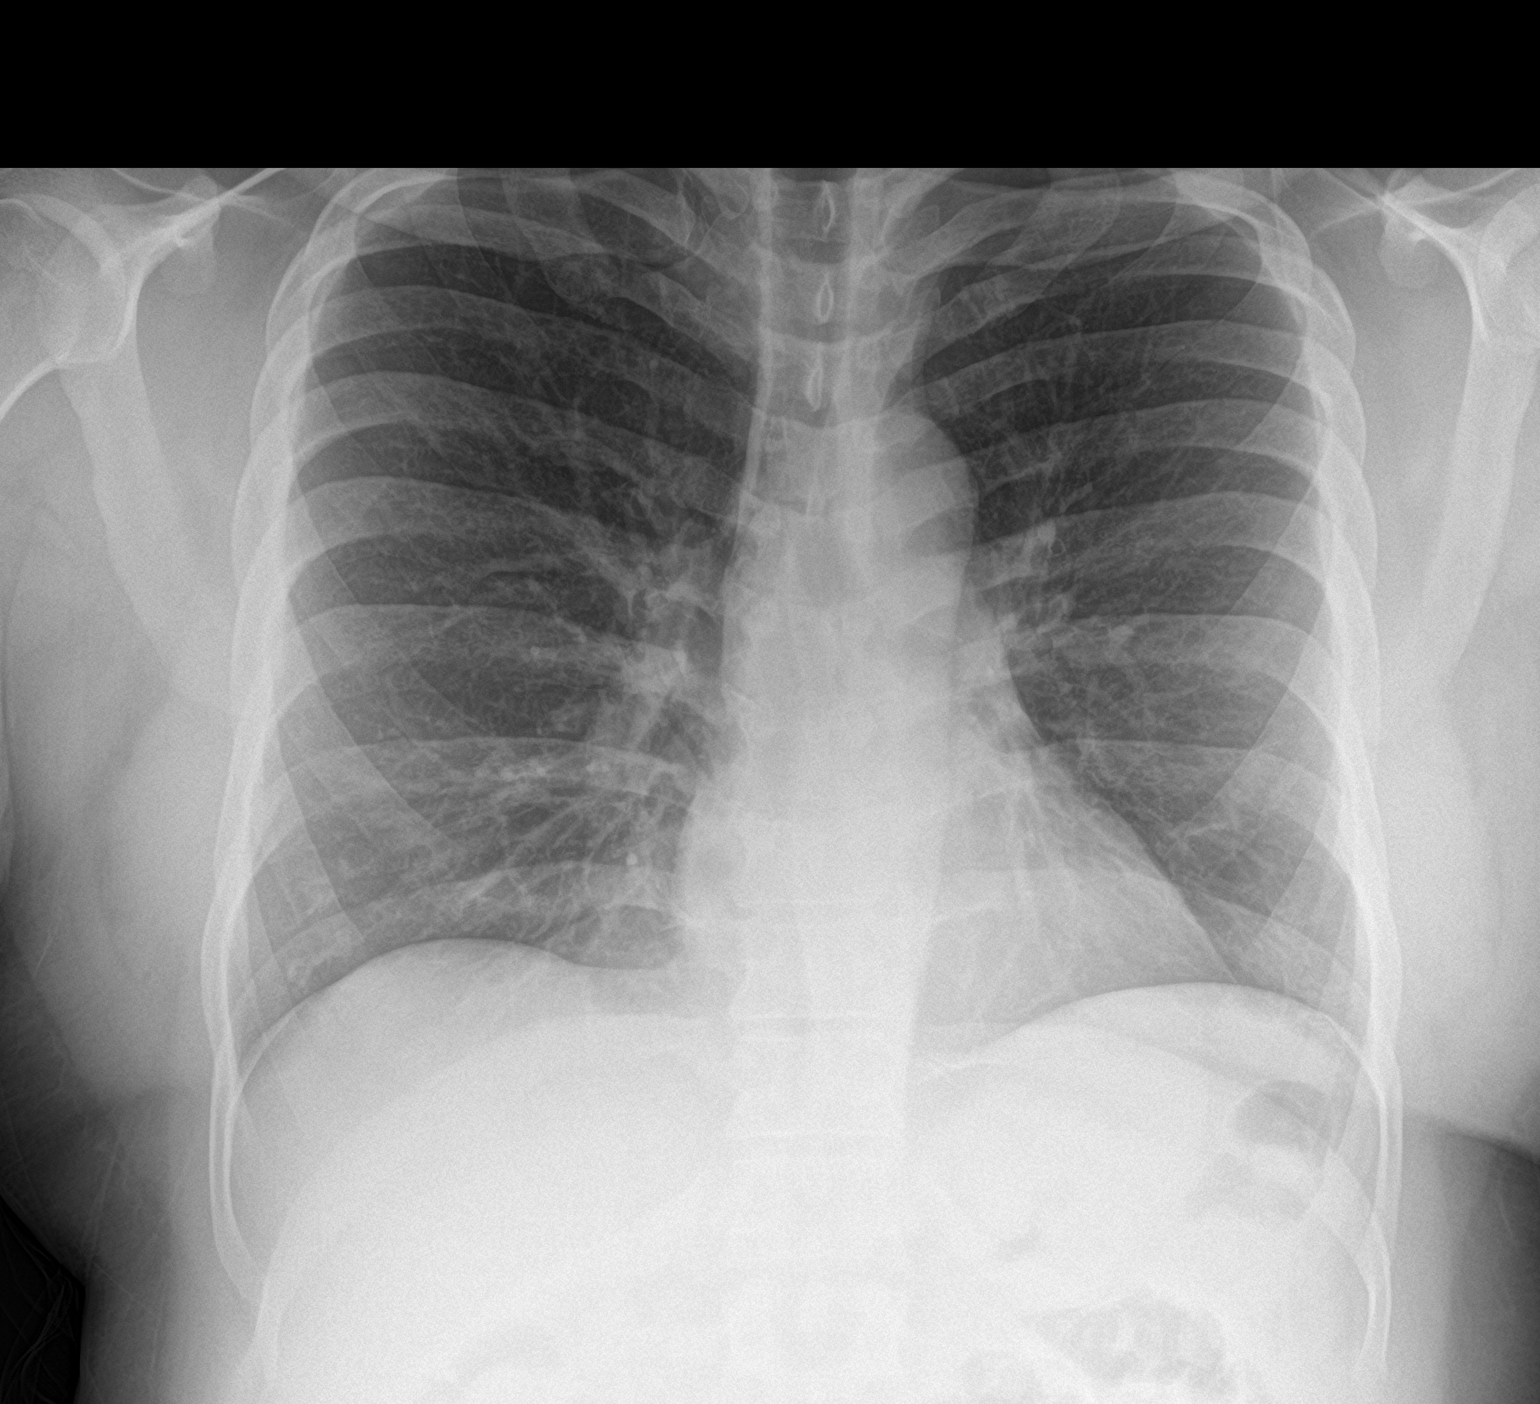

[chest lat]
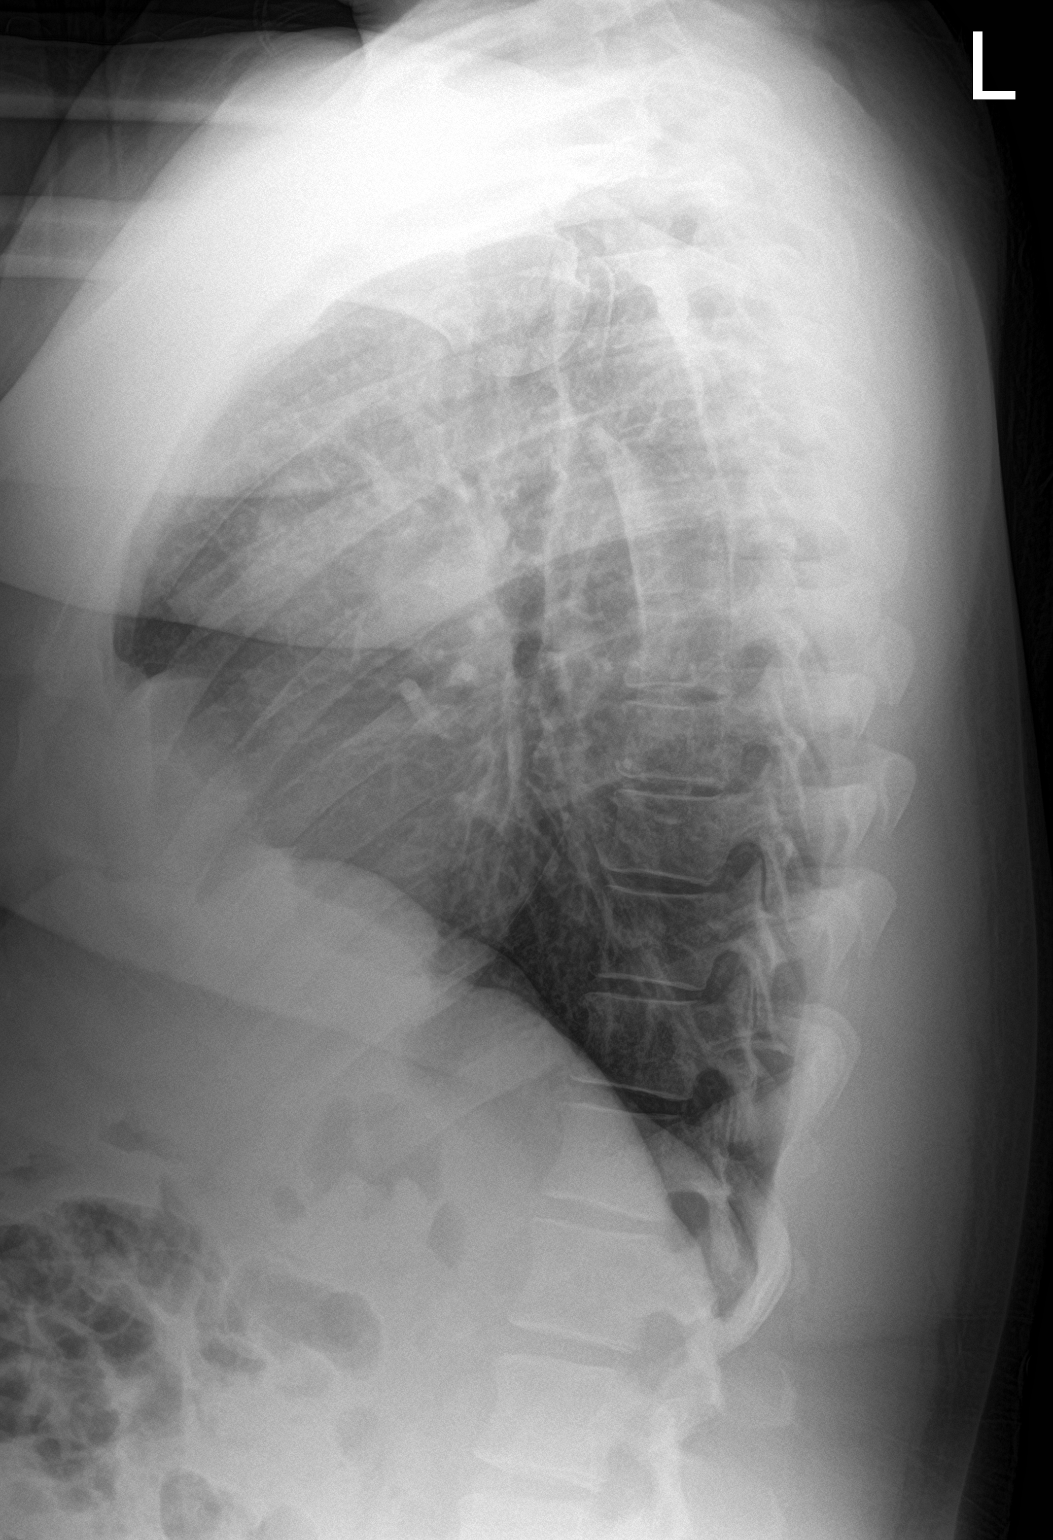

[2 of 2 positions shown; findings below may reference images not displayed]

FINDINGS: Heart size and mediastinal contours are within normal limits. Lungs
are clear. Lung volumes are normal. No pleural effusion or
pneumothorax is seen. Osseous structures about the chest are
unremarkable.
IMPRESSION: Normal chest x-ray.

## 2023-09-03 ENCOUNTER — Emergency Department (HOSPITAL_BASED_OUTPATIENT_CLINIC_OR_DEPARTMENT_OTHER)
Admission: EM | Admit: 2023-09-03 | Discharge: 2023-09-03 | Disposition: A | Discharge status: Home / Self Care | Payer: Self-pay | Attending: Emergency Medicine | Admitting: Emergency Medicine

## 2023-09-03 ENCOUNTER — Emergency Department (HOSPITAL_BASED_OUTPATIENT_CLINIC_OR_DEPARTMENT_OTHER): Payer: Self-pay

## 2023-09-03 ENCOUNTER — Other Ambulatory Visit: Payer: Self-pay

## 2023-09-03 ENCOUNTER — Encounter (HOSPITAL_BASED_OUTPATIENT_CLINIC_OR_DEPARTMENT_OTHER): Payer: Self-pay

## 2023-09-03 DIAGNOSIS — S022XXA Fracture of nasal bones, initial encounter for closed fracture: Secondary | ICD-10-CM

## 2023-09-03 DIAGNOSIS — S0219XA Other fracture of base of skull, initial encounter for closed fracture: Secondary | ICD-10-CM | POA: Insufficient documentation

## 2023-09-03 MED ORDER — IBUPROFEN 800 MG PO TABS
800.0000 mg | ORAL_TABLET | Freq: Once | ORAL | Status: AC
  Administered 2023-09-03: 800 mg via ORAL
  Filled 2023-09-03: qty 1
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
## 2023-09-03 NOTE — ED Notes (Signed)
 Discharge paperwork given and verbally understood.
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
